# Patient Record
Sex: Male | Born: 1935 | Race: White | Hispanic: No | Smoking: Former smoker
Health system: Southern US, Community
[De-identification: ages and names within clinical notes are randomized; demographics above are authoritative.]

## PROBLEM LIST (undated history)

## (undated) DIAGNOSIS — I10 Essential (primary) hypertension: Secondary | ICD-10-CM

## (undated) DIAGNOSIS — E039 Hypothyroidism, unspecified: Secondary | ICD-10-CM

## (undated) DIAGNOSIS — I714 Abdominal aortic aneurysm, without rupture, unspecified: Secondary | ICD-10-CM

## (undated) DIAGNOSIS — M4800 Spinal stenosis, site unspecified: Secondary | ICD-10-CM

## (undated) DIAGNOSIS — N400 Enlarged prostate without lower urinary tract symptoms: Secondary | ICD-10-CM

## (undated) DIAGNOSIS — J449 Chronic obstructive pulmonary disease, unspecified: Secondary | ICD-10-CM

## (undated) DIAGNOSIS — I6529 Occlusion and stenosis of unspecified carotid artery: Secondary | ICD-10-CM

## (undated) DIAGNOSIS — G2581 Restless legs syndrome: Secondary | ICD-10-CM

## (undated) DIAGNOSIS — R0989 Other specified symptoms and signs involving the circulatory and respiratory systems: Secondary | ICD-10-CM

## (undated) DIAGNOSIS — R0609 Other forms of dyspnea: Secondary | ICD-10-CM

## (undated) DIAGNOSIS — R39198 Other difficulties with micturition: Secondary | ICD-10-CM

## (undated) DIAGNOSIS — I219 Acute myocardial infarction, unspecified: Secondary | ICD-10-CM

## (undated) DIAGNOSIS — F0390 Unspecified dementia without behavioral disturbance: Secondary | ICD-10-CM

## (undated) HISTORY — DX: Occlusion and stenosis of unspecified carotid artery: I65.29

## (undated) HISTORY — DX: Unspecified dementia, unspecified severity, without behavioral disturbance, psychotic disturbance, mood disturbance, and anxiety: F03.90

## (undated) HISTORY — DX: Chronic obstructive pulmonary disease, unspecified: J44.9

## (undated) HISTORY — PX: LUMBAR LAMINECTOMY: SHX95

## (undated) HISTORY — DX: Hypothyroidism, unspecified: E03.9

## (undated) HISTORY — DX: Other forms of dyspnea: R06.09

## (undated) HISTORY — DX: Abdominal aortic aneurysm, without rupture: I71.4

## (undated) HISTORY — DX: Acute myocardial infarction, unspecified: I21.9

## (undated) HISTORY — DX: Benign prostatic hyperplasia without lower urinary tract symptoms: N40.0

## (undated) HISTORY — DX: Abdominal aortic aneurysm, without rupture, unspecified: I71.40

## (undated) HISTORY — DX: Essential (primary) hypertension: I10

## (undated) HISTORY — DX: Other specified symptoms and signs involving the circulatory and respiratory systems: R09.89

## (undated) HISTORY — DX: Spinal stenosis, site unspecified: M48.00

## (undated) HISTORY — DX: Restless legs syndrome: G25.81

## (undated) HISTORY — DX: Other difficulties with micturition: R39.198

---

## 1994-06-13 DIAGNOSIS — I219 Acute myocardial infarction, unspecified: Secondary | ICD-10-CM

## 1994-06-13 HISTORY — DX: Acute myocardial infarction, unspecified: I21.9

## 1996-06-13 HISTORY — PX: CORONARY ARTERY BYPASS GRAFT: SHX141

## 1998-05-25 ENCOUNTER — Ambulatory Visit (HOSPITAL_COMMUNITY): Admission: RE | Admit: 1998-05-25 | Discharge: 1998-05-25 | Payer: Self-pay | Admitting: Cardiology

## 1998-05-25 ENCOUNTER — Encounter: Payer: Self-pay | Admitting: Cardiology

## 1998-07-24 ENCOUNTER — Encounter: Payer: Self-pay | Admitting: Cardiology

## 1998-10-03 ENCOUNTER — Inpatient Hospital Stay (HOSPITAL_COMMUNITY): Admission: EM | Admit: 1998-10-03 | Discharge: 1998-10-12 | Payer: Self-pay | Admitting: Emergency Medicine

## 1998-10-03 ENCOUNTER — Encounter: Payer: Self-pay | Admitting: Cardiology

## 1998-10-05 ENCOUNTER — Encounter: Payer: Self-pay | Admitting: Emergency Medicine

## 1998-10-08 ENCOUNTER — Encounter: Payer: Self-pay | Admitting: Cardiothoracic Surgery

## 1998-10-09 ENCOUNTER — Encounter: Payer: Self-pay | Admitting: Cardiothoracic Surgery

## 1998-11-10 ENCOUNTER — Encounter (HOSPITAL_COMMUNITY): Admission: RE | Admit: 1998-11-10 | Discharge: 1999-01-11 | Payer: Self-pay | Admitting: Cardiology

## 1998-11-13 ENCOUNTER — Ambulatory Visit (HOSPITAL_COMMUNITY): Admission: RE | Admit: 1998-11-13 | Discharge: 1998-11-13 | Payer: Self-pay | Admitting: Cardiology

## 1998-11-13 ENCOUNTER — Encounter: Payer: Self-pay | Admitting: Cardiology

## 1999-01-12 ENCOUNTER — Encounter (HOSPITAL_COMMUNITY): Admission: RE | Admit: 1999-01-12 | Discharge: 1999-04-12 | Payer: Self-pay | Admitting: Cardiology

## 1999-08-04 ENCOUNTER — Encounter: Payer: Self-pay | Admitting: Cardiology

## 1999-08-04 ENCOUNTER — Encounter: Admission: RE | Admit: 1999-08-04 | Discharge: 1999-08-04 | Payer: Self-pay | Admitting: Cardiology

## 2004-06-13 DIAGNOSIS — I714 Abdominal aortic aneurysm, without rupture, unspecified: Secondary | ICD-10-CM

## 2004-06-13 HISTORY — DX: Abdominal aortic aneurysm, without rupture: I71.4

## 2004-06-13 HISTORY — DX: Abdominal aortic aneurysm, without rupture, unspecified: I71.40

## 2004-12-13 ENCOUNTER — Ambulatory Visit: Payer: Self-pay | Admitting: Internal Medicine

## 2004-12-13 ENCOUNTER — Inpatient Hospital Stay (HOSPITAL_COMMUNITY): Admission: EM | Admit: 2004-12-13 | Discharge: 2004-12-16 | Payer: Self-pay | Admitting: Internal Medicine

## 2004-12-15 ENCOUNTER — Ambulatory Visit: Payer: Self-pay | Admitting: Cardiology

## 2004-12-15 ENCOUNTER — Encounter: Payer: Self-pay | Admitting: Cardiology

## 2004-12-23 ENCOUNTER — Ambulatory Visit: Payer: Self-pay | Admitting: Internal Medicine

## 2005-01-06 ENCOUNTER — Ambulatory Visit: Payer: Self-pay | Admitting: Internal Medicine

## 2005-01-18 ENCOUNTER — Ambulatory Visit: Payer: Self-pay | Admitting: Internal Medicine

## 2005-02-11 ENCOUNTER — Ambulatory Visit: Payer: Self-pay | Admitting: Internal Medicine

## 2005-02-17 ENCOUNTER — Ambulatory Visit (HOSPITAL_COMMUNITY): Admission: RE | Admit: 2005-02-17 | Discharge: 2005-02-17 | Payer: Self-pay | Admitting: *Deleted

## 2005-02-23 ENCOUNTER — Emergency Department (HOSPITAL_COMMUNITY): Admission: EM | Admit: 2005-02-23 | Discharge: 2005-02-23 | Payer: Self-pay | Admitting: Emergency Medicine

## 2005-03-07 ENCOUNTER — Ambulatory Visit: Payer: Self-pay | Admitting: Internal Medicine

## 2005-03-27 ENCOUNTER — Ambulatory Visit (HOSPITAL_COMMUNITY): Admission: RE | Admit: 2005-03-27 | Discharge: 2005-03-27 | Payer: Self-pay | Admitting: *Deleted

## 2005-12-30 ENCOUNTER — Ambulatory Visit: Payer: Self-pay | Admitting: Internal Medicine

## 2006-01-18 ENCOUNTER — Ambulatory Visit: Payer: Self-pay | Admitting: Internal Medicine

## 2006-01-18 ENCOUNTER — Encounter (INDEPENDENT_AMBULATORY_CARE_PROVIDER_SITE_OTHER): Payer: Self-pay | Admitting: *Deleted

## 2006-01-18 LAB — CONVERTED CEMR LAB
Cholesterol: 159 mg/dL
Creatinine, Ser: 1.4 mg/dL
Glucose, Bld: 97 mg/dL
HDL: 55 mg/dL
LDL Cholesterol: 90 mg/dL
Triglyceride fasting, serum: 71 mg/dL

## 2006-01-25 ENCOUNTER — Ambulatory Visit: Payer: Self-pay | Admitting: Internal Medicine

## 2006-03-08 LAB — FECAL OCCULT BLOOD, GUAIAC: Fecal Occult Blood: NEGATIVE

## 2006-04-15 ENCOUNTER — Encounter: Payer: Self-pay | Admitting: *Deleted

## 2006-04-15 DIAGNOSIS — I714 Abdominal aortic aneurysm, without rupture, unspecified: Secondary | ICD-10-CM | POA: Insufficient documentation

## 2006-04-15 DIAGNOSIS — M79609 Pain in unspecified limb: Secondary | ICD-10-CM | POA: Insufficient documentation

## 2006-04-15 DIAGNOSIS — I10 Essential (primary) hypertension: Secondary | ICD-10-CM | POA: Insufficient documentation

## 2006-04-15 DIAGNOSIS — G4700 Insomnia, unspecified: Secondary | ICD-10-CM | POA: Insufficient documentation

## 2006-05-19 DIAGNOSIS — E039 Hypothyroidism, unspecified: Secondary | ICD-10-CM | POA: Insufficient documentation

## 2006-05-19 DIAGNOSIS — I252 Old myocardial infarction: Secondary | ICD-10-CM

## 2006-05-19 DIAGNOSIS — I6529 Occlusion and stenosis of unspecified carotid artery: Secondary | ICD-10-CM

## 2006-07-04 ENCOUNTER — Emergency Department (HOSPITAL_COMMUNITY): Admission: EM | Admit: 2006-07-04 | Discharge: 2006-07-04 | Payer: Self-pay | Admitting: Emergency Medicine

## 2006-07-10 ENCOUNTER — Emergency Department (HOSPITAL_COMMUNITY): Admission: EM | Admit: 2006-07-10 | Discharge: 2006-07-10 | Payer: Self-pay | Admitting: Emergency Medicine

## 2006-07-31 ENCOUNTER — Ambulatory Visit: Payer: Self-pay | Admitting: Internal Medicine

## 2006-07-31 ENCOUNTER — Encounter (INDEPENDENT_AMBULATORY_CARE_PROVIDER_SITE_OTHER): Payer: Self-pay | Admitting: *Deleted

## 2006-07-31 DIAGNOSIS — G609 Hereditary and idiopathic neuropathy, unspecified: Secondary | ICD-10-CM | POA: Insufficient documentation

## 2006-07-31 LAB — CONVERTED CEMR LAB
ALT: 9 units/L (ref 0–53)
AST: 15 units/L (ref 0–37)
Albumin: 3.8 g/dL (ref 3.5–5.2)
Alkaline Phosphatase: 80 units/L (ref 39–117)
BUN: 17 mg/dL (ref 6–23)
CO2: 25 meq/L (ref 19–32)
Calcium: 8.9 mg/dL (ref 8.4–10.5)
Chloride: 109 meq/L (ref 96–112)
Creatinine, Ser: 1.05 mg/dL (ref 0.40–1.50)
Glucose, Bld: 70 mg/dL (ref 70–99)
HCT: 36.9 % — ABNORMAL LOW (ref 39.0–52.0)
Hemoglobin: 12.1 g/dL — ABNORMAL LOW (ref 13.0–17.0)
MCHC: 32.8 g/dL (ref 30.0–36.0)
MCV: 103.4 fL — ABNORMAL HIGH (ref 78.0–100.0)
Platelets: 181 10*3/uL (ref 150–400)
Potassium: 4.5 meq/L (ref 3.5–5.3)
RBC: 3.57 M/uL — ABNORMAL LOW (ref 4.22–5.81)
RDW: 14.3 % — ABNORMAL HIGH (ref 11.5–14.0)
Sodium: 142 meq/L (ref 135–145)
TSH: 3.009 microintl units/mL (ref 0.350–5.50)
Total Bilirubin: 0.9 mg/dL (ref 0.3–1.2)
Total Protein: 6.2 g/dL (ref 6.0–8.3)
Vitamin B-12: 546 pg/mL (ref 211–911)
WBC: 6.9 10*3/uL (ref 4.0–10.5)

## 2006-10-24 ENCOUNTER — Ambulatory Visit: Payer: Self-pay | Admitting: Hospitalist

## 2006-10-24 ENCOUNTER — Encounter (INDEPENDENT_AMBULATORY_CARE_PROVIDER_SITE_OTHER): Payer: Self-pay | Admitting: *Deleted

## 2006-10-24 DIAGNOSIS — D539 Nutritional anemia, unspecified: Secondary | ICD-10-CM | POA: Insufficient documentation

## 2006-10-24 LAB — CONVERTED CEMR LAB: RBC Folate: 1145 ng/mL — ABNORMAL HIGH (ref 180–600)

## 2006-11-01 LAB — CONVERTED CEMR LAB
Free T4: 1.04 ng/dL (ref 0.89–1.80)
TSH: 4.495 microintl units/mL (ref 0.350–5.50)

## 2007-02-07 ENCOUNTER — Telehealth: Payer: Self-pay | Admitting: *Deleted

## 2007-09-17 ENCOUNTER — Telehealth (INDEPENDENT_AMBULATORY_CARE_PROVIDER_SITE_OTHER): Payer: Self-pay | Admitting: *Deleted

## 2008-08-08 ENCOUNTER — Inpatient Hospital Stay (HOSPITAL_COMMUNITY): Admission: EM | Admit: 2008-08-08 | Discharge: 2008-08-11 | Payer: Self-pay | Admitting: Emergency Medicine

## 2008-08-08 ENCOUNTER — Ambulatory Visit: Payer: Self-pay | Admitting: Infectious Disease

## 2008-08-09 ENCOUNTER — Encounter: Payer: Self-pay | Admitting: *Deleted

## 2008-08-09 LAB — CONVERTED CEMR LAB
Cholesterol: 151 mg/dL
HDL: 50 mg/dL
LDL Cholesterol: 87 mg/dL
Triglycerides: 69 mg/dL

## 2008-08-11 ENCOUNTER — Ambulatory Visit: Payer: Self-pay | Admitting: Vascular Surgery

## 2008-08-11 ENCOUNTER — Encounter: Payer: Self-pay | Admitting: Infectious Disease

## 2008-08-13 ENCOUNTER — Encounter: Payer: Self-pay | Admitting: Internal Medicine

## 2008-08-20 ENCOUNTER — Encounter: Payer: Self-pay | Admitting: *Deleted

## 2008-09-08 ENCOUNTER — Encounter: Payer: Self-pay | Admitting: Internal Medicine

## 2008-09-08 ENCOUNTER — Ambulatory Visit: Payer: Self-pay | Admitting: Internal Medicine

## 2008-09-08 DIAGNOSIS — Z8669 Personal history of other diseases of the nervous system and sense organs: Secondary | ICD-10-CM | POA: Insufficient documentation

## 2008-09-08 LAB — CONVERTED CEMR LAB
BUN: 20 mg/dL (ref 6–23)
Basophils Absolute: 0 10*3/uL (ref 0.0–0.1)
Basophils Relative: 0 % (ref 0–1)
CO2: 30 meq/L (ref 19–32)
Calcium: 9 mg/dL (ref 8.4–10.5)
Chloride: 109 meq/L (ref 96–112)
Creatinine, Ser: 1.14 mg/dL (ref 0.40–1.50)
Eosinophils Absolute: 0.3 10*3/uL (ref 0.0–0.7)
Eosinophils Relative: 4 % (ref 0–5)
GFR calc Af Amer: >60 mL/min (ref >60)
GFR calc non Af Amer: 60 mL/min — ABNORMAL LOW (ref >60)
Glucose, Bld: 95 mg/dL (ref 70–99)
HCT: 37.1 % — ABNORMAL LOW (ref 39.0–52.0)
Hemoglobin: 12.7 g/dL — ABNORMAL LOW (ref 13.0–17.0)
Lymphocytes Relative: 33 % (ref 12–46)
Lymphs Abs: 2.3 10*3/uL (ref 0.7–4.0)
MCHC: 34.3 g/dL (ref 30.0–36.0)
MCV: 101.2 fL — ABNORMAL HIGH (ref 78.0–100.0)
Monocytes Absolute: 0.6 10*3/uL (ref 0.1–1.0)
Monocytes Relative: 9 % (ref 3–12)
Neutro Abs: 3.8 10*3/uL (ref 1.7–7.7)
Neutrophils Relative %: 54 % (ref 43–77)
Platelets: 166 10*3/uL (ref 150–400)
Potassium: 4.9 meq/L (ref 3.5–5.3)
RBC: 3.66 M/uL — ABNORMAL LOW (ref 4.22–5.81)
RDW: 14.1 % (ref 11.5–15.5)
Sodium: 143 meq/L (ref 135–145)
WBC: 7 10*3/uL (ref 4.0–10.5)

## 2008-11-03 ENCOUNTER — Telehealth: Payer: Self-pay | Admitting: Internal Medicine

## 2008-12-04 ENCOUNTER — Ambulatory Visit: Payer: Self-pay | Admitting: Internal Medicine

## 2009-08-04 IMAGING — CT CT HEAD W/O CM
1 series · 16 of 30 positions shown, 20 images · non-contrast
Comparison: CT brain of 07/10/2006

CLINICAL DATA: Dizziness, weakness, syncope, irregular heartbeat

CT HEAD WITHOUT CONTRAST
TECHNIQUE: Contiguous axial images were obtained from the base of
the skull through the vertex without contrast.

[Series 2: brain · axial · 0.47mm/px · z∈[+120,+261]mm · 16 of 30 slices shown, 20 images]
[im 2/30  brain]
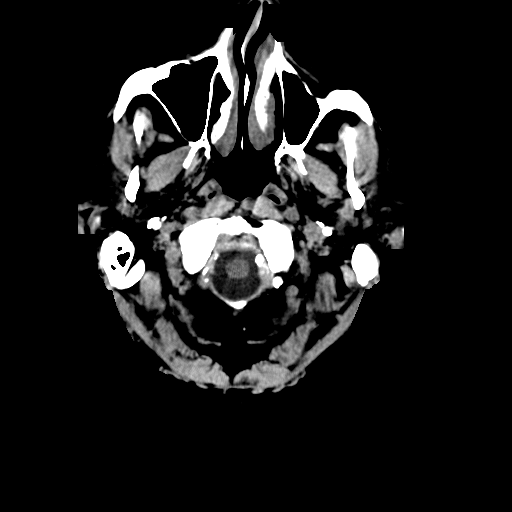
[im 2/30  bone]
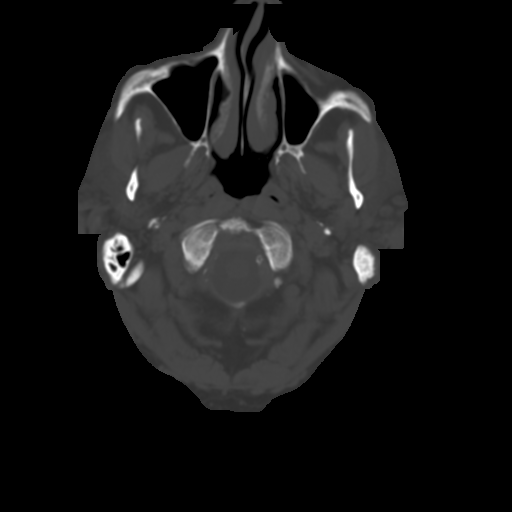
[im 4/30  brain]
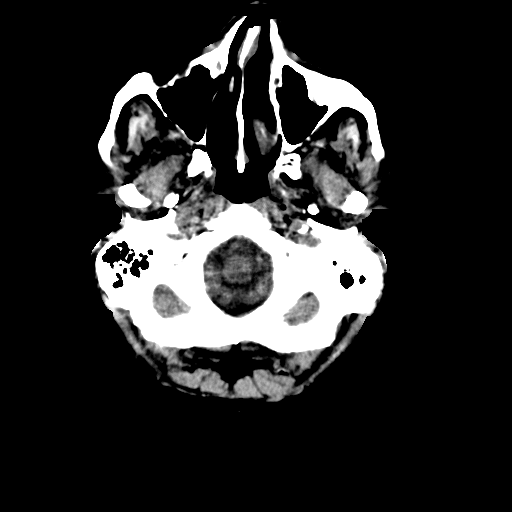
[im 6/30  brain]
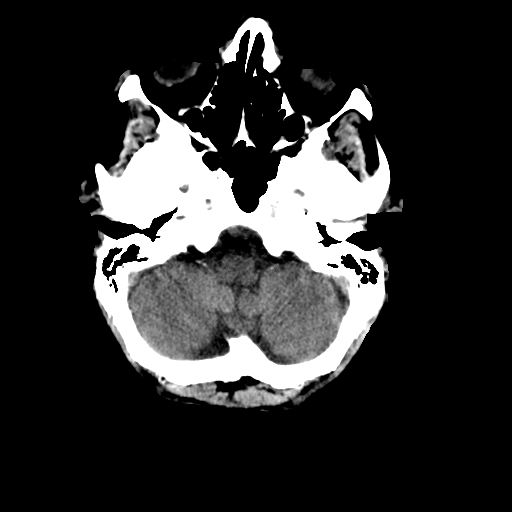
[im 8/30  brain]
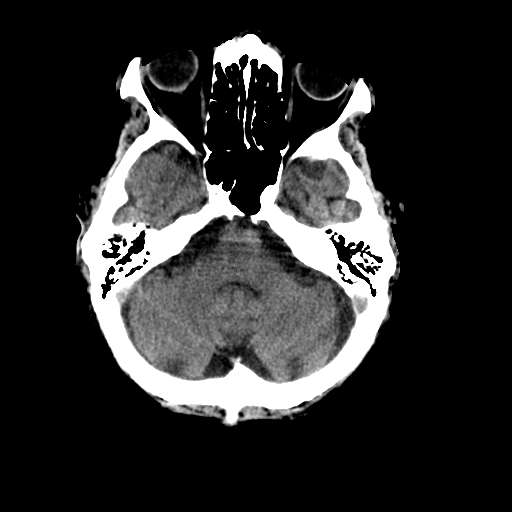
[im 9/30  brain]
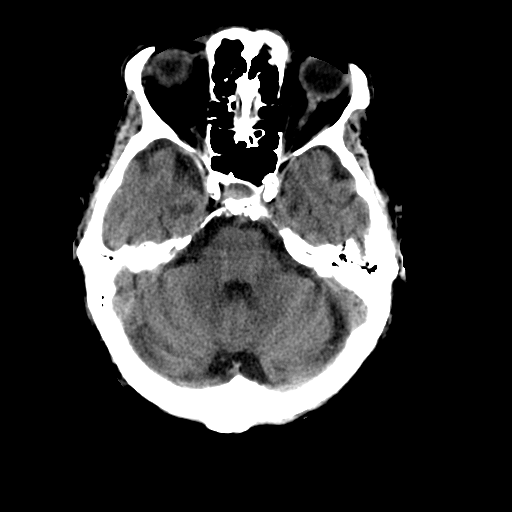
[im 9/30  bone]
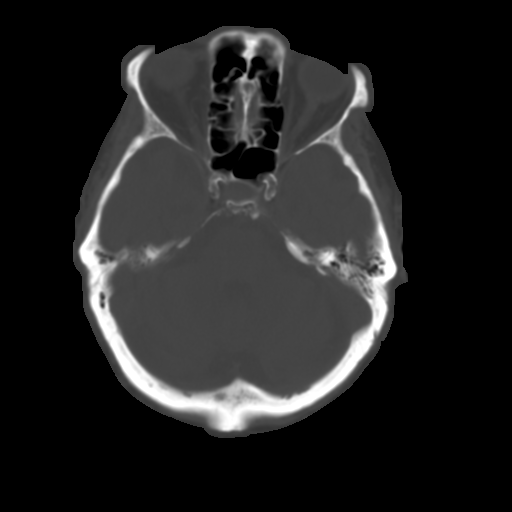
[im 11/30  brain]
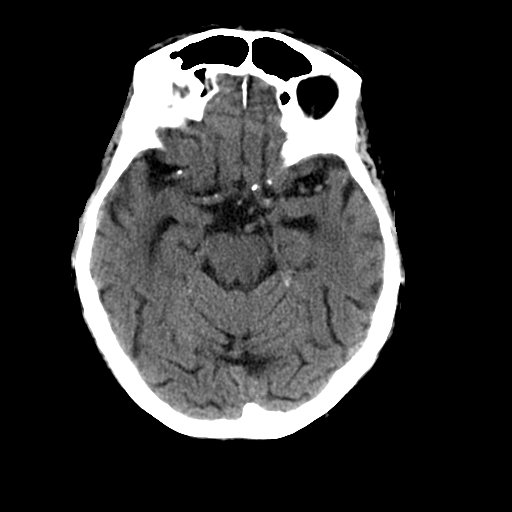
[im 13/30  brain]
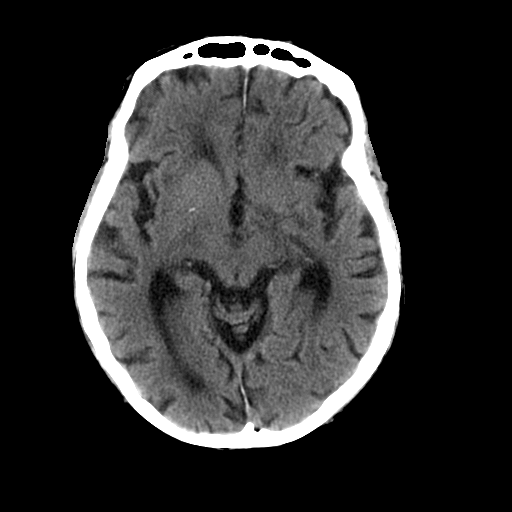
[im 15/30  brain]
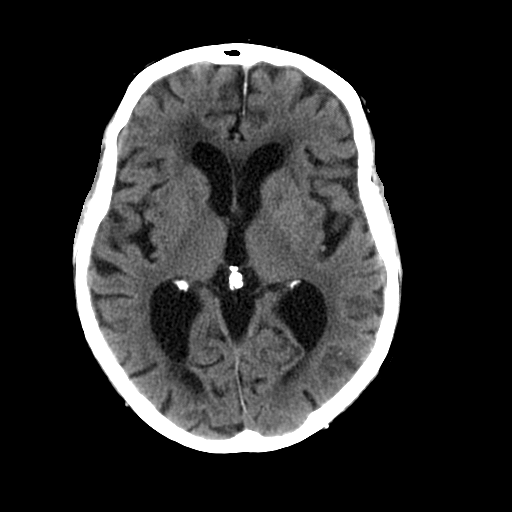
[im 16/30  brain]
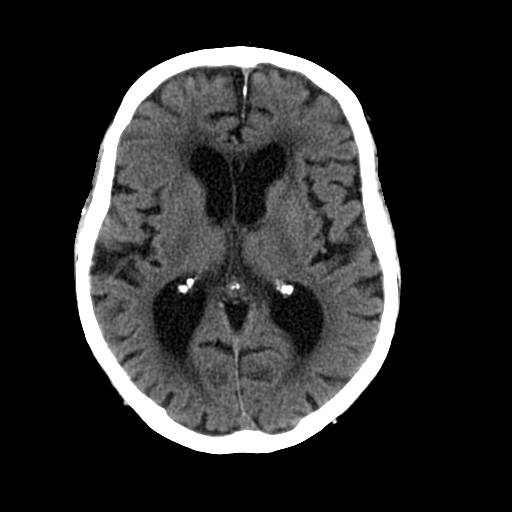
[im 16/30  bone]
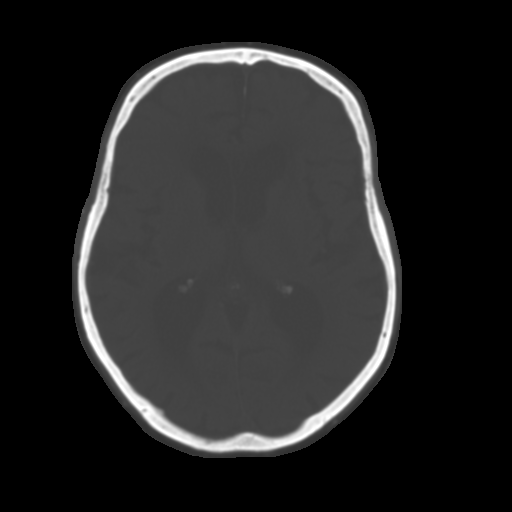
[im 18/30  brain]
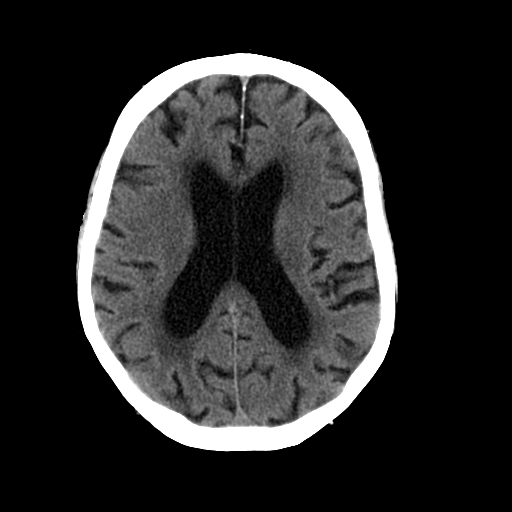
[im 20/30  brain]
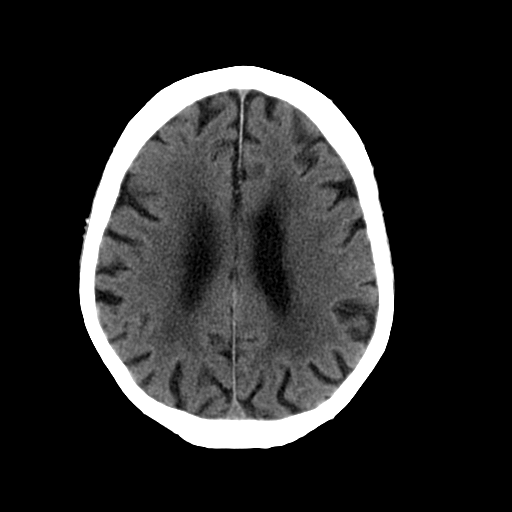
[im 22/30  brain]
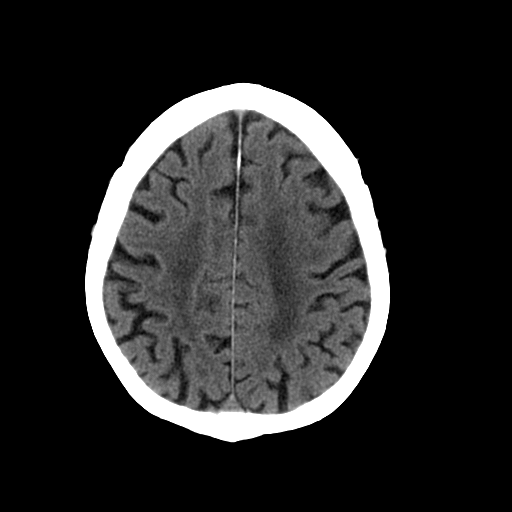
[im 23/30  brain]
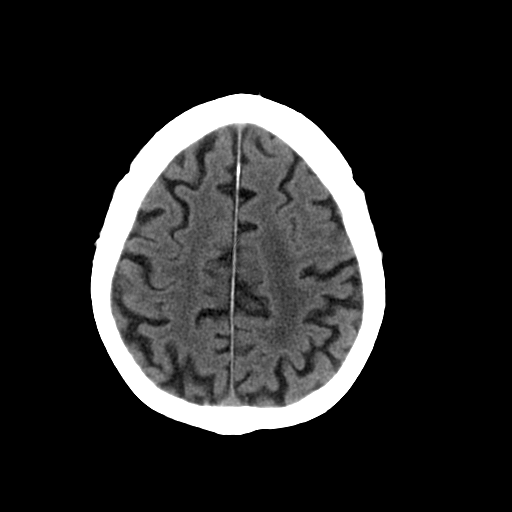
[im 23/30  bone]
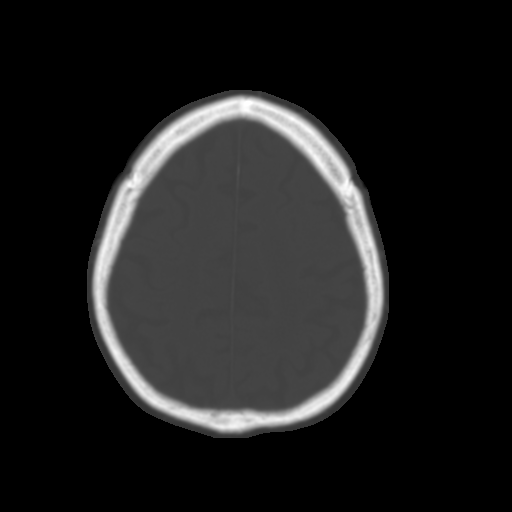
[im 25/30  brain]
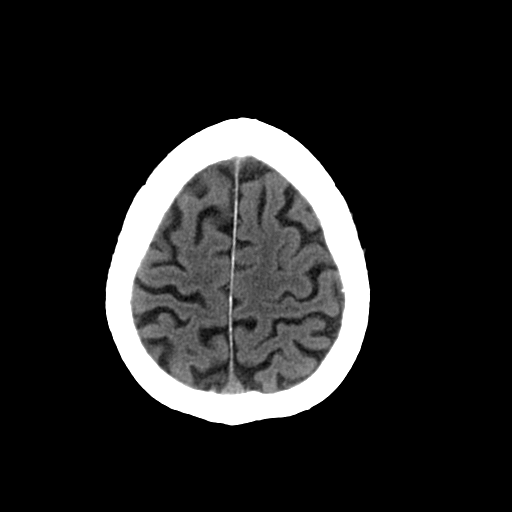
[im 27/30  brain]
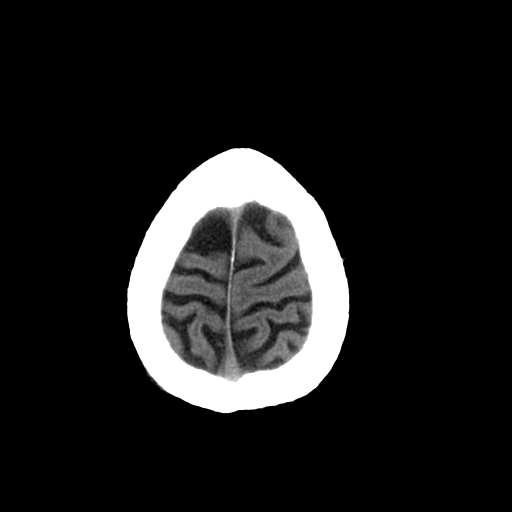
[im 29/30  brain]
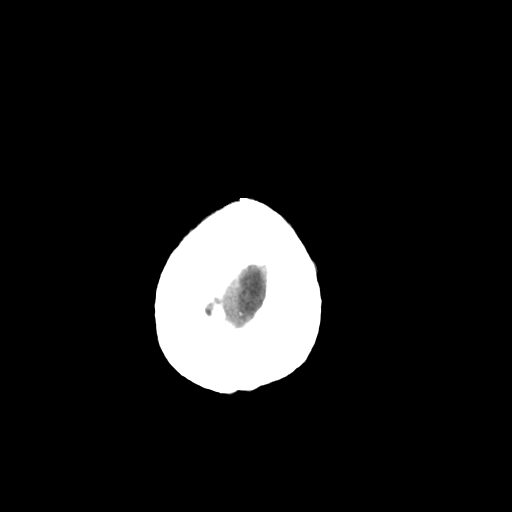

[16 of 30 positions shown; findings below may reference images not displayed]

FINDINGS: There is no change in the degree of dilatation of the
ventricles, with prominent cortical sulci, indicative of diffuse
atrophy.  Moderate small vessel ischemic change is again noted
throughout the periventricular white matter.  No blood, edema, or
mass effect is seen.  No acute calvarial abnormality is seen.
IMPRESSION: Atrophy and small vessel disease.  No acute intracranial
abnormality.

## 2010-06-24 ENCOUNTER — Ambulatory Visit: Admit: 2010-06-24 | Payer: Self-pay

## 2010-07-04 ENCOUNTER — Encounter: Payer: Self-pay | Admitting: *Deleted

## 2010-08-04 ENCOUNTER — Ambulatory Visit: Payer: Self-pay | Admitting: Internal Medicine

## 2010-08-05 ENCOUNTER — Encounter: Payer: Self-pay | Admitting: Internal Medicine

## 2010-09-28 LAB — CBC
HCT: 35.6 % — ABNORMAL LOW (ref 39.0–52.0)
Hemoglobin: 11.3 g/dL — ABNORMAL LOW (ref 13.0–17.0)
Hemoglobin: 12.4 g/dL — ABNORMAL LOW (ref 13.0–17.0)
MCHC: 34.6 g/dL (ref 30.0–36.0)
MCHC: 34.9 g/dL (ref 30.0–36.0)
MCV: 101.4 fL — ABNORMAL HIGH (ref 78.0–100.0)
MCV: 99.5 fL (ref 78.0–100.0)
Platelets: 160 10*3/uL (ref 150–400)
Platelets: 172 10*3/uL (ref 150–400)
RBC: 3.17 MIL/uL — ABNORMAL LOW (ref 4.22–5.81)
RBC: 3.31 MIL/uL — ABNORMAL LOW (ref 4.22–5.81)
RBC: 3.57 MIL/uL — ABNORMAL LOW (ref 4.22–5.81)
RDW: 14.4 % (ref 11.5–15.5)
WBC: 6.5 10*3/uL (ref 4.0–10.5)
WBC: 7.1 10*3/uL (ref 4.0–10.5)
WBC: 7.3 10*3/uL (ref 4.0–10.5)

## 2010-09-28 LAB — DIFFERENTIAL
Basophils Absolute: 0.1 10*3/uL (ref 0.0–0.1)
Basophils Relative: 1 % (ref 0–1)
Eosinophils Absolute: 0.2 10*3/uL (ref 0.0–0.7)
Eosinophils Relative: 3 % (ref 0–5)
Lymphocytes Relative: 29 % (ref 12–46)
Lymphs Abs: 2 10*3/uL (ref 0.7–4.0)
Monocytes Absolute: 0.6 10*3/uL (ref 0.1–1.0)
Monocytes Relative: 9 % (ref 3–12)
Neutro Abs: 4.1 10*3/uL (ref 1.7–7.7)
Neutrophils Relative %: 59 % (ref 43–77)

## 2010-09-28 LAB — RAPID URINE DRUG SCREEN, HOSP PERFORMED
Barbiturates: NOT DETECTED
Benzodiazepines: NOT DETECTED
Cocaine: NOT DETECTED
Opiates: NOT DETECTED

## 2010-09-28 LAB — COMPREHENSIVE METABOLIC PANEL
ALT: 15 U/L (ref 0–53)
AST: 21 U/L (ref 0–37)
Alkaline Phosphatase: 70 U/L (ref 39–117)
GFR calc Af Amer: >60 mL/min (ref >60)
Glucose, Bld: 101 mg/dL — ABNORMAL HIGH (ref 70–99)
Potassium: 4.9 mEq/L (ref 3.5–5.1)
Sodium: 141 mEq/L (ref 135–145)
Total Protein: 6.3 g/dL (ref 6.0–8.3)

## 2010-09-28 LAB — POCT I-STAT, CHEM 8
BUN: 21 mg/dL (ref 6–23)
Calcium, Ion: 1.17 mmol/L (ref 1.12–1.32)
Chloride: 110 mEq/L (ref 96–112)
Creatinine, Ser: 1.4 mg/dL (ref 0.4–1.5)
Glucose, Bld: 81 mg/dL (ref 70–99)
HCT: 37 % — ABNORMAL LOW (ref 39.0–52.0)
Hemoglobin: 12.6 g/dL — ABNORMAL LOW (ref 13.0–17.0)
Potassium: 4.9 mEq/L (ref 3.5–5.1)
Sodium: 142 mEq/L (ref 135–145)
TCO2: 23 mmol/L (ref 0–100)

## 2010-09-28 LAB — CARDIAC PANEL(CRET KIN+CKTOT+MB+TROPI)
CK, MB: 1.3 ng/mL (ref 0.3–4.0)
Relative Index: INVALID (ref 0.0–2.5)
Relative Index: INVALID (ref 0.0–2.5)
Total CK: 36 U/L (ref 7–232)
Total CK: 47 U/L (ref 7–232)
Troponin I: 0.01 ng/mL (ref 0.00–0.06)
Troponin I: 0.01 ng/mL (ref 0.00–0.06)

## 2010-09-28 LAB — BASIC METABOLIC PANEL
CO2: 25 mEq/L (ref 19–32)
Calcium: 8.5 mg/dL (ref 8.4–10.5)
Chloride: 111 mEq/L (ref 96–112)
Creatinine, Ser: 1.09 mg/dL (ref 0.4–1.5)
Creatinine, Ser: 1.09 mg/dL (ref 0.4–1.5)
GFR calc Af Amer: >60 mL/min (ref >60)
GFR calc Af Amer: >60 mL/min (ref >60)
Sodium: 143 mEq/L (ref 135–145)
Sodium: 143 mEq/L (ref 135–145)

## 2010-09-28 LAB — URINALYSIS, ROUTINE W REFLEX MICROSCOPIC
Bilirubin Urine: NEGATIVE
Glucose, UA: NEGATIVE mg/dL
Hgb urine dipstick: NEGATIVE
Ketones, ur: NEGATIVE mg/dL
Protein, ur: NEGATIVE mg/dL
pH: 6 (ref 5.0–8.0)

## 2010-09-28 LAB — POCT CARDIAC MARKERS
CKMB, poc: 1.1 ng/mL (ref 1.0–8.0)
Myoglobin, poc: 102 ng/mL (ref 12–200)
Troponin i, poc: <0.05 ng/mL (ref 0.00–0.09)

## 2010-09-28 LAB — LIPID PANEL
Triglycerides: 69 mg/dL (ref <150)
VLDL: 14 mg/dL (ref 0–40)

## 2010-09-28 LAB — PROTIME-INR
INR: 1.1 (ref 0.00–1.49)
Prothrombin Time: 14 seconds (ref 11.6–15.2)

## 2010-09-28 LAB — VITAMIN B12: Vitamin B-12: 533 pg/mL (ref 211–911)

## 2010-09-28 LAB — HEMOGLOBIN A1C: Hgb A1c MFr Bld: 5.3 % (ref 4.6–6.1)

## 2010-09-28 LAB — TSH
TSH: 3.023 u[IU]/mL (ref 0.350–4.500)
TSH: 3.313 u[IU]/mL (ref 0.350–4.500)

## 2010-09-28 LAB — BRAIN NATRIURETIC PEPTIDE: Pro B Natriuretic peptide (BNP): 707 pg/mL — ABNORMAL HIGH (ref 0.0–100.0)

## 2010-09-28 LAB — RPR: RPR Ser Ql: NONREACTIVE

## 2010-09-28 LAB — MAGNESIUM: Magnesium: 2.3 mg/dL (ref 1.5–2.5)

## 2010-09-28 LAB — CK TOTAL AND CKMB (NOT AT ARMC): Total CK: 43 U/L (ref 7–232)

## 2010-09-28 LAB — T4, FREE: Free T4: 0.96 ng/dL (ref 0.89–1.80)

## 2010-09-28 LAB — FOLATE RBC: RBC Folate: 1339 ng/mL — ABNORMAL HIGH (ref 180–600)

## 2010-10-26 NOTE — Discharge Summary (Signed)
NAME:  Vernon English, KRING NO.:  [id number]   MEDICAL RECORD NO.:  [id number]          PATIENT TYPE:  INP   LOCATION:  3712                         FACILITY:  [facility]   PHYSICIAN:  Acey Lav, MD  DATE OF BIRTH:  1916-06-13   DATE OF ADMISSION:  08/08/2008  DATE OF DISCHARGE:  08/11/2008                               DISCHARGE SUMMARY   DIAGNOSES:  1. Syncope.  possibly due secondary to sick sinus syndrome or other      rythym disturbance, no syncopal episodes during the      hospitalization.  The patient was stable on discharge.  2. Macrocytic anemia.  Stable with baseline hemoglobin at 11-12.  3. Hypothyroidism.  TSH stable.  No medication indicated.  4. Abdominal aortic aneurysm.  stable since 2001 size 3.6 x 3.90 in      2001 and 2006.  5. Hypertension.  Not on any medication.  Isolated systolic      hypertension with blood pressure in the 140s.  6. Benign prostatic hypertrophy.  Not on any medication.  no      obstructive symptoms.  7. Chronic obstructive pulmonary disease.  Stable with no signs of      exacerbation during the hospitalization.  8. Cellulitis of the right hand in 2006.  9. Bilateral carotid stenosis 50% occlusion bilaterally.  10.Lumbar spine stenosis L3-L4.  11.Coronary artery disease status post coronary artery bypass graft      after MI in 1996.   DISCHARGE MEDICATIONS:  1. Aspirin 325 mg take one tablet once daily.  2. Multivitamin take one tablet once daily.   DISPOSITION/FOLLOWUP:  The patient was discharged from the unit in  stable condition with no syncopal episodes during the hospitalization,  ambulating without assistance, no falls.  Stable and Afebrile throughout  the hospitalization.  On follow-up appointment please assess B-met for  electrolyte abnormalities.  On admission creatinine was 1.4 which was  likely secondary to dehydration on discharge creatinine was within  normal limits.  Please also check CBC since the  patient has anemia of  chronic disease with MCV in lower 100, so assess if stable.  Will also  follow up on results of 2-D echo since on discharge 2-D echo pending.  Also, assess blood pressure and if systolic blood pressure in stable  range.   CONSULTATIONS:  None.   PROCEDURES PERFORMED:  February 26 chest x-ray, cardiomegaly and the  patient status post CABG.  Lungs emphysematous scarring noted and right  and left lung base, unchanged from previous study, no pleural effusion.  No acute disease.  CT of the head without contrast August 08, 2008.  No change in degree of dilation of the ventricles, moderate small vessel  ischemic change noted, unchanged from previous study, no blood, edema or  mass effect seen.  No acute intracranial abnormalities Overall.   HISTORY OF PRESENT ILLNESS:  The patient is a 77 year old male with past  medical history significant for coronary artery disease status post CABG  after MI in 1996, history of presyncope, vertigo, aortic stenosis,  hypothyroidism, abdominal aortic aneurysm and bilateral carotid artery  stenosis who presents to ED concerned for syncopal episode that occurred  5 days prior to admission.  He reports feeling dizzy at that time,  especially with standing up, and he had one syncopal episodes.  He  reports history of occasional dizziness, especially associated with  standing up that has been going on for many years.  In addition, he  reports occasional chronic cough productive of white sputum, very small  amount.  He denies headaches, visual changes, chest pain, palpitations,  no shortness of breath.  No lower extremity edema.  No abdominal  concerns such as nausea, vomiting, diarrhea, constipation.  The patient  reports not losing consciousness at the time of the episode which  happened while he was shopping at [facility].  No tongue biting.  No urinary  or bowel incontinence.   PHYSICAL EXAMINATION:  VITAL SIGNS:  Temperature 97.6, blood  pressure  155/61, pulse 55, respirations 20, saturating 100% on room air.  GENERAL:  The patient sitting in bed, not in acute distress.  HEENT:  PERRLA, EOMI, moist mucous membranes, no oropharyngeal erythema.  No scleral icterus.  NECK:  Supple.  Full range of motion.  No lymphadenopathy.  RESPIRATIONS:  Clear to auscultation bilaterally.  No wheezing, crackles  or rhonchi.  CARDIOVASCULAR:  Regular rhythm, bradycardic.  S1, S2 present.  Occasional ectopic beats, systolic ejection murmurs, 3/6 no rubs or  gallops.  ABDOMEN:  Soft, nontender, nondistended.  Bowel sounds present.  GU:  No gross vertebral angle tenderness.  LYMPH:  No lymphadenopathy.  MUSCULOSKELETAL:  Full range of motion.  No effusion or stiffness.  NEUROLOGICAL:  Alert and oriented x3.  Cranial nerves II-XII grossly  intact.  Motor and sensation in both intact.   LABORATORY DATA:  NA 142, K 4.9, CL 110, HCO3 23, BUN 24, CR 1.4,  glucose 81, WBC 7.1, hemoglobin 12.4, platelets 172, MCV 99.5, ANC 4.1.  PT 14, INR 1.1.   PROBLEMS:  1. Syncope in the setting of bradycardia and one episode of blacking      out.  Most worrisome was cardiac etiology, specifically sick sinus      syndrome.  The patient was admitted to telemetry.  Cardiac enzymes      were cycled and EKG was reviewed.  Initial EKG showed normal sinus      rhythm at 58 beats per minute, occasional premature atrial      complexes.  No significant ST-T segment changes.  Review of      telemetry did not show any pauses, blocks.  No ST-T segment      abnormalities.  No arrhythmia.  In addition, cardiac enzymes x3 all      negative.  UDS negative and TSH was within normal limits.  Concern      was also for neurologic etiology of syncope such as TIA or CVA,      however, unlikely since on physical exam no acute neurologic      findings.  CT of the head was negative.  Carotid artery Doppler      done and showed no significant stenosis on discharge 2-D echo.       Standing orthostatics were within normal limits.  Fasting lipid      profile was within normal limits, slightly elevated LDL at 87 and      (goal LDL less than 70).  Electrolytes remained within normal      limits.  B12 level within normal limits.  RPR nonreactive.  The  patient was discharged in stable condition with no syncopal      episodes and ambulatory.  We offered the patient holter monitor,      event monitor and referral to cardiology but he was against this.      He is agreeable to have this workup completed should he have return      of syncope or presyncompal symptoms.  2. Macrocytic anemia.  This was worked up in the past.  The patient      had increased folic acid, normal MMA, B12 and folate during this      admission within normal limits, MCV stable, hemoglobin stable at      12.  3. Hypertension.  The patient is not on any medications at home.  He      has isolated systolic blood pressure.  No indication for treatment      in the setting of aortic stenosis and carotid artery occlusion      bilateral.  4. Benign prostatic hypertrophy.  The patient is not on any      medications for it, has been stable.  No obstructive symptoms.  5. Chronic obstructive pulmonary disease.  There were no signs of      extubation during the hospitalization.  The patient maintained      saturations over 95% on room air.  6. Hypothyroidism.  The patient is not on any medications for      hypothyroidism.  TSH was checked and was within normal limits.      Therefore, no medical treatment indicated.  7. Abdominal aortic aneurysm.  Comparing records from 2001 and 2006,      aneurysm only slightly increased in size from 3.6 cm to 3.9.      Therefore, no additional workup indicated at this time.  8. History of coronary artery bypass graft, status post myocardial      infarctions in 1996.  Discussed with the patient possibility of      having pacemaker put in if needed.  However, the patient  reports      not being interested in procedure and elected not to have it done.      We will follow up with the patient in outpatient clinic for further      management.   DISCHARGE VITAL SIGNS:  Temperature 97, blood pressure 150/72, pulse 80,  respirations 18, saturating 95% on room air.   DISCHARGE LABORATORIES:  Labs drawn on August 10, 2008, NA 142, K 4.5  CL 113, HCO3 27, BUN 20, creatinine 1.09 and glucose 95, calcium 8.5,  WBC 6.5, hemoglobin 11.3, platelets 162, MCV 100.   TOTAL TIME OF DISCHARGE:  Over 30 minutes spent on dictation.      Mliss Sax, MD  Electronically Signed      Acey Lav, MD  Electronically Signed    IM/MEDQ  D:  08/11/2008  T:  08/11/2008  Job:  [id number]

## 2010-10-29 NOTE — Discharge Summary (Signed)
NAME:  Vernon English, Vernon English NO.:  [id number]   MEDICAL RECORD NO.:  [id number]          PATIENT TYPE:  INP   LOCATION:  4734                         FACILITY:  [facility]   PHYSICIAN:  Madaline Guthrie, M.D.    DATE OF BIRTH:  02/28/1916   DATE OF ADMISSION:  12/13/2004  DATE OF DISCHARGE:  12/16/2004                                 DISCHARGE SUMMARY   DISCHARGE DIAGNOSES:  1.  Near syncope of undetermined origin.  2.  Status post myocardial infarction in 1996 with coronary artery bypass      grafting x4.  3.  History of hypothyroidism, now untreated for 9 months with a normal TSH      and free T4.  4.  History of carotid stenosis at 51% bilaterally.  5.  Mild aortic stenosis, aortic insufficiency, and mitral stenosis.  6.  Intracranial atherosclerosis.   DISCHARGE MEDICATIONS:  1.  Aggrenox 1 capsule p.o. b.i.d.   CONDITION ON DISCHARGE:  Asymptomatic.   SPECIAL INSTRUCTIONS:  Will need followup on folate and vitamin B12 levels,  which are pending at this moment.   PROCEDURE:  1.  CT of the head showed no acute intracranial abnormality.  2.  MRI of the head showed no abnormalities.  3.  MRA of the head showed intracranial atherosclerotic type of changes.   HISTORY OF PRESENT ILLNESS:  Vernon English is an 77 year old patient who  presented with a near syncope episode when getting out of bed in the  morning. He felt dizzy and light headed with no associated chest pain,  palpitations or nausea. He had no hearing loss, fullness in the ears or  tinnitus but says that he did feel dizzy to a lesser extent when he turns  his head to the left. He had no associated loss of vision or paralysis or  numbness. He felt that way every time that he tried to sit or stand up since  that morning. He says that he has had a similar episode 1 time the year  before. Note that the patient is under a lot of stress in the past few days  due to the sudden death of his oldest son from an acute  myocardial  infarction. He did not have any fever or chills.   PHYSICAL EXAMINATION:  VITAL SIGNS:  Pulse 66, blood pressure very variable  going from 182/78 to 145/62. Temperature of 97.9. Respiratory rate of 20.  Saturation of 97% on room air.  GENERAL:  Alert and oriented. Had no nystagmus with the lateral rotation of  the head. No carotid bruit.  LUNGS:  Examination had no particularities.  HEART:  We could hear a 3 over 6 systolic murmur, heard best at the base,  without radiation.  The rest of the examinations including pulses and neurologic examination  were normal.   LABORATORY DATA:  On admission white blood cell count of 6.7. Hemoglobin  12.5 with a MCV of 99.9. Platelets at 190,000. His cardiac markers were  negative x3. Electrolytes were normal. TSH and free T4 were normal.   A head CT showed no acute findings.  Orthostatic blood pressure showed drop in systolic pressure of 19 from lying  to standing and a pulse going from 64 to 63.   HOSPITAL COURSE:  Vernon English was put on the monitor for observation. He had  no alarming events that could explain his dizziness. MRI and CT of the head  showed no abnormalities. MRA, however, showed intracranial atherosclerotic  type of changes. We then started him on Aggrenox twice a day, since we could  not exclude the possibility that his symptoms were due to transient ischemic  attack instead of an inner ear problem, orthostatic's, or stress.   DISCHARGE LABORATORY DATA/VITALS:  His blood pressure went from 184/88 to  149/71. He had a regular pulse of 78, respiratory rate of 20, temperature  98.2 and saturation at 97% on room air.   White blood cells at 6.8, hemoglobin 11.6 with a MCV of 100.3 and platelets  at 173. Electrolytes were normal. Had a lipid panel, which was completely  normal also. Pending labs are folate and vitamin B12 levels.   FOLLOW UP:  I will see Vernon English in the outpatient clinic for hospital  followup on  December 23, 2004 at 2:30 p.m.       VD/MEDQ  D:  12/16/2004  T:  12/17/2004  Job:  [id number]   cc:   Madaline Guthrie, M.D.  [address]  [place] [address]  Fax: [phone]

## 2011-01-21 ENCOUNTER — Ambulatory Visit (INDEPENDENT_AMBULATORY_CARE_PROVIDER_SITE_OTHER): Payer: Self-pay | Admitting: Internal Medicine

## 2011-01-21 ENCOUNTER — Encounter: Payer: Self-pay | Admitting: Internal Medicine

## 2011-01-21 VITALS — BP 126/60 | HR 58 | Temp 96.7°F | Ht 70.5 in | Wt 179.2 lb

## 2011-01-21 DIAGNOSIS — I10 Essential (primary) hypertension: Secondary | ICD-10-CM

## 2011-01-21 NOTE — Assessment & Plan Note (Addendum)
Blood pressure is well controlled. We recheck blood pressure 15 minutes into the examination and the blood pressure was 120/86. Patient was advised to check his blood pressure regularly at home and to cause tachycardia the numbers are higher than 150/90.

## 2011-01-21 NOTE — Progress Notes (Signed)
  Subjective:    Patient ID: Vernon English, male    DOB: 06/05/1916, 77 y.o.   MRN: [id number]  HPI Patient is a 77 year old male with past medical history outlined below who presents to clinic for regular followup on hypertension. He reports compliance with recommended medications exercise and diet. He denies recent sicknesses or hospitalizations, no episodes of chest pain or shortness of breath, no abdominal urine or concerns, no fevers chills or cough.   Review of Systems Constitutional: Denies fever, chills, diaphoresis, appetite change and fatigue.  HEENT: Denies photophobia, eye pain, redness, hearing loss, ear pain, congestion, sore throat, rhinorrhea, sneezing, mouth sores, trouble swallowing, neck pain, neck stiffness and tinnitus.   Respiratory: Denies SOB, DOE, cough, chest tightness,  and wheezing.   Cardiovascular: Denies chest pain, palpitations and leg swelling.  Gastrointestinal: Denies nausea, vomiting, abdominal pain, diarrhea, constipation, blood in stool and abdominal distention.  Genitourinary: Denies dysuria, urgency, frequency, hematuria, flank pain and difficulty urinating.  Musculoskeletal: Denies myalgias, back pain, joint swelling, arthralgias and gait problem.  Skin: Denies pallor, rash and wound.  Neurological: Denies dizziness, seizures, syncope, weakness, light-headedness, numbness and headaches.  Hematological: Denies adenopathy. Easy bruising, personal or family bleeding history  Psychiatric/Behavioral: Denies suicidal ideation, mood changes, confusion, nervousness, sleep disturbance and agitation      Objective:   Physical Exam Constitutional: Vital signs reviewed.  Patient is a well-developed and well-nourished in no acute distress and cooperative with exam. Alert and oriented x3.  Head: Normocephalic and atraumatic Ear: TM normal bilaterally Mouth: no erythema or exudates, MMM Eyes: PERRL, EOMI, conjunctivae normal, No scleral icterus.  Neck:  Supple, Trachea midline normal ROM, No JVD, mass, thyromegaly, or carotid bruit present.  Cardiovascular: RRR, S1 normal, S2 normal, no MRG, pulses symmetric and intact bilaterally Pulmonary/Chest: CTAB, no wheezes, rales, or rhonchi Abdominal: Soft. Non-tender, non-distended, bowel sounds are normal, no masses, organomegaly, or guarding present.  GU: no CVA tenderness Musculoskeletal: No joint deformities, erythema, or stiffness, ROM full and no nontender Hematology: no cervical, inginal, or axillary adenopathy.  Neurological: A&O x3, Strenght is normal and symmetric bilaterally, cranial nerve II-XII are grossly intact, no focal motor deficit, sensory intact to light touch bilaterally.  Skin: Warm, dry and intact. No rash, cyanosis, or clubbing.  Psychiatric: Normal mood and affect. speech and behavior is normal. Judgment and thought content normal. Cognition and memory are normal.          Assessment & Plan:

## 2011-01-26 NOTE — Progress Notes (Signed)
Reck BP 01/26/11 10:30AM 156/68 pulse 67 left arm per Dr Aldine Contes. Stanton Kidney Sharron Simpson RN 01/26/11 10:30AM

## 2011-06-21 ENCOUNTER — Ambulatory Visit (INDEPENDENT_AMBULATORY_CARE_PROVIDER_SITE_OTHER): Payer: Medicare Other

## 2011-06-21 DIAGNOSIS — G2581 Restless legs syndrome: Secondary | ICD-10-CM

## 2011-08-26 ENCOUNTER — Telehealth: Payer: Self-pay

## 2011-08-26 NOTE — Telephone Encounter (Signed)
DR.HOPPER:  HOSPICE STATES THAT PT'S SON WOULD LIKE FOR PT TO BE PLACED UNDER THE CARE OF HOSPICE. THEY WILL NEED AN ORDER FROM YOU IN ORDER FOR PT TO HAVE HOSPICE. THEY ALSO WOULD LIKE TO KNOW IF YOU WOULD BE THE ATTENDING PROVIDER. [phone] [phone]

## 2011-08-29 NOTE — Telephone Encounter (Signed)
Kathy from Hospice called back concerning patient. I informed her that Dr. Alwyn Ren would not be able to take care of the Pt as a Hospice Doctor. He also would need to consult with Alwyn Ren for him to clear for Hospice. I also informed her that [place] will be [place] for [place] next couple weeks and he would be unable to f/u on this matter.

## 2011-08-29 NOTE — Telephone Encounter (Signed)
I would need to see the patient to clearly document the need for hospice. I cannot be the hospice physician for the patient. I feel that [place] am gone [place] to often to allow proper supervision of someone on hospice.

## 2011-09-29 ENCOUNTER — Telehealth: Payer: Self-pay

## 2011-09-29 NOTE — Telephone Encounter (Signed)
DR. Nicholaus Bloom at [facility] would like an order to place this patient in their care.  Also, would you like to be the attending physician?    Phone number is [phone] Valinda Hoar number is [phone]

## 2011-09-30 NOTE — Telephone Encounter (Signed)
I am not assuming responsibility for being the hospice doctor for any patients as I feel I cannot do so with my periodic absences.  I advise they see an internist or geriatrics specialist (give the geriatric group contact info to them).

## 2011-10-03 NOTE — Telephone Encounter (Signed)
Spoke with hospice and let them know that Dr. Alwyn Ren was not comfortable taking over this care due to his traveling out of the country so we recommended that they call Dr Lenoria Farrier Neva Seat.  They understood and were going to call him today.

## 2011-12-16 ENCOUNTER — Telehealth: Payer: Self-pay

## 2011-12-16 NOTE — Telephone Encounter (Signed)
CATHY FROM HOSPICE WOULD LIKE OV NOTES FAXED ON PT. PLEASE FAX TO [phone] AND THE PHONE IS [phone]

## 2011-12-19 ENCOUNTER — Telehealth: Payer: Self-pay

## 2011-12-19 NOTE — Telephone Encounter (Signed)
Printed out phone message due to patient only having a paper chart. °

## 2012-03-06 NOTE — Telephone Encounter (Signed)
done

## 2012-09-12 ENCOUNTER — Other Ambulatory Visit: Payer: Self-pay | Admitting: Nurse Practitioner

## 2012-10-11 ENCOUNTER — Other Ambulatory Visit: Payer: Self-pay | Admitting: Internal Medicine

## 2012-10-12 ENCOUNTER — Other Ambulatory Visit: Payer: Self-pay | Admitting: *Deleted

## 2012-10-12 ENCOUNTER — Encounter: Payer: Self-pay | Admitting: Geriatric Medicine

## 2012-10-12 MED ORDER — ROPINIROLE HCL 1 MG PO TABS: ORAL_TABLET | ORAL | Status: DC

## 2012-10-15 ENCOUNTER — Ambulatory Visit (INDEPENDENT_AMBULATORY_CARE_PROVIDER_SITE_OTHER): Payer: Medicare Other | Admitting: Nurse Practitioner

## 2012-10-15 ENCOUNTER — Encounter: Payer: Self-pay | Admitting: Nurse Practitioner

## 2012-10-15 VITALS — BP 118/58 | HR 66 | Temp 96.1°F | Resp 16 | Ht 69.0 in | Wt 167.8 lb

## 2012-10-15 DIAGNOSIS — K5900 Constipation, unspecified: Secondary | ICD-10-CM | POA: Insufficient documentation

## 2012-10-15 DIAGNOSIS — I714 Abdominal aortic aneurysm, without rupture: Secondary | ICD-10-CM

## 2012-10-15 DIAGNOSIS — G2581 Restless legs syndrome: Secondary | ICD-10-CM | POA: Insufficient documentation

## 2012-10-15 MED ORDER — ROPINIROLE HCL 1 MG PO TABS: ORAL_TABLET | ORAL | Status: DC

## 2012-10-15 NOTE — Assessment & Plan Note (Signed)
Unchanged- does not wish to have any additional studies done

## 2012-10-15 NOTE — Progress Notes (Signed)
Patient ID: Vernon English, male   DOB: [birth year], 79 y.o.   MRN: [id number] Code Status: NCB   No Known Allergies  Chief Complaint  Patient presents with  . Medical Managment of Chronic Issues  . Constipation  . restless legs    HPI: Patient is a 79 y.o. male seen in the office today for routine follow up Constipation worse- only uses MOM  Restless leg getting worse- Rx if for every 8 hours but he is needing it more frequently--at this time every 6 hours worse  Stopped taking namenda just because and does not wish to restart it Not having paranoid behaviors any more at this time.   Review of Systems:  Review of Systems  Constitutional: Negative for weight loss.  Respiratory: Negative for cough. Shortness of breath: shortness of breath with exertion    Cardiovascular: Negative for chest pain, palpitations and leg swelling.  Gastrointestinal: Positive for constipation. Negative for heartburn.  Musculoskeletal: Negative for myalgias, back pain, joint pain and falls.  Skin: Negative.   Neurological: Negative for dizziness, weakness and headaches.       Restless leg syndrome   Psychiatric/Behavioral: Positive for memory loss.     Past Medical History  Diagnosis Date  . Hypertension   . Hypothyroidism   . Abdominal aortic aneurysm 2006    3.9x3.6 cm per CT scan 2006  . Carotid artery stenosis   . COPD (chronic obstructive pulmonary disease)     per CT scan severe fibrosis, pt is asymptomatic  . Spinal stenosis     L3-L4  . Myocardial infarction 1996    s/p CABG  . Senile dementia, uncomplicated   . Hypertrophy of prostate without urinary obstruction and other lower urinary tract symptoms (LUTS)   . Restless legs syndrome (RLS)   . Abdominal aneurysm without mention of rupture   . Other dyspnea and respiratory abnormality   . Slowing of urinary stream    Past Surgical History  Procedure Laterality Date  . Lumbar laminectomy      L4-L5  . Coronary artery bypass  graft  1998   Social History:   reports that he quit smoking about 58 years ago. His smoking use included Cigarettes. He smoked 0.00 packs per day. He does not have any smokeless tobacco history on file. He reports that  drinks alcohol. He reports that he does not use illicit drugs.  Family History  Problem Relation Age of Onset  . Heart disease Son   . Heart disease Father   . Heart disease Brother     Medications: Patient's Medications  New Prescriptions   No medications on file  Previous Medications   ASPIRIN 81 MG EC TABLET    Take 81 mg by mouth 2 (two) times daily.    Modified Medications   Modified Medication Previous Medication   ROPINIROLE (REQUIP) 1 MG TABLET rOPINIRole (REQUIP) 1 MG tablet      Take one tablet every 6 hours as needed for restless leg syndrome.    Take one tablet every 8 hours for restless leg syndrome.  Discontinued Medications   MEMANTINE HCL ER (NAMENDA XR) 28 MG CP24    Take by mouth. Take one tablet once daily to preserve memory.     Physical Exam: Physical Exam  Constitutional: He is oriented to person, place, and time. He appears well-developed and well-nourished. No distress.  HENT:  Head: Normocephalic and atraumatic.  Eyes: EOM are normal. Pupils are equal, round, and reactive to light.  Neck: Normal range of motion. Neck supple.  Cardiovascular: Normal rate and regular rhythm.   Murmur heard. Pulmonary/Chest: Effort normal and breath sounds normal. No respiratory distress.  Abdominal: Soft. Bowel sounds are normal. He exhibits abdominal bruit and pulsatile midline mass.  Musculoskeletal: Normal range of motion. He exhibits no edema and no tenderness.  Neurological: He is alert and oriented to person, place, and time.  Skin: Skin is warm and dry. He is not diaphoretic.  Psychiatric: He has a normal mood and affect.    Filed Vitals:   10/15/12 1023  BP: 118/58  Pulse: 66  Temp: 96.1 F (35.6 C)  Resp: 16  Height: 5\' 9"  (1.753 m)   Weight: 167 lb 12.8 oz (76.114 kg)      Labs reviewed: Does not wish to have any lab work done    Assessment/Plan ABDOMINAL AORTIC ANEURYSM Unchanged- does not wish to have any additional studies done  Unspecified constipation Can try Colace (stool softener) 100 mg 1 to 2 times daily can start with 2 daily and can decrease to 1, and benefiber or metamucil mixed into drink- 1 to 2 times daily. Miralax 17gm into drink daily if needed for constipation. Increase water intake   Restless leg Try tonic water to help with restless leg Will increase requip q 6 hours as needed     To follow up in office as needed

## 2012-10-15 NOTE — Assessment & Plan Note (Signed)
Try tonic water to help with restless leg Will increase requip q 6 hours as needed

## 2012-10-15 NOTE — Patient Instructions (Addendum)
FOR CONSTIPATION  Colace (stool softener) 100 mg 1 to 2 times daily-- would start with 2 daily and can decrease to 1  benefiber or metamucil mixed into drink- 1 to 2 times daily-- start with 1 daily  Miralax 17gm into drink daily if needed for constipation -- can try if MOM does not work  For Restless Leg- may try tonic water   Constipation, Adult Constipation is when a person has fewer than 3 bowel movements a week; has difficulty having a bowel movement; or has stools that are dry, hard, or larger than normal. As people grow older, constipation is more common. If you try to fix constipation with medicines that make you have a bowel movement (laxatives), the problem may get worse. Long-term laxative use may cause the muscles of the colon to become weak. A low-fiber diet, not taking in enough fluids, and taking certain medicines may make constipation worse. CAUSES   Certain medicines, such as antidepressants, pain medicine, iron supplements, antacids, and water pills.   Certain diseases, such as diabetes, irritable bowel syndrome (IBS), thyroid disease, or depression.   Not drinking enough water.   Not eating enough fiber-rich foods.   Stress or travel.  Lack of physical activity or exercise.  Not going to the restroom when there is the urge to have a bowel movement.  Ignoring the urge to have a bowel movement.  Using laxatives too much. SYMPTOMS   Having fewer than 3 bowel movements a week.   Straining to have a bowel movement.   Having hard, dry, or larger than normal stools.   Feeling full or bloated.   Pain in the lower abdomen.  Not feeling relief after having a bowel movement. DIAGNOSIS  Your caregiver will take a medical history and perform a physical exam. Further testing may be done for severe constipation. Some tests may include:   A barium enema X-ray to examine your rectum, colon, and sometimes, your small intestine.  A sigmoidoscopy to examine  your lower colon.  A colonoscopy to examine your entire colon. TREATMENT  Treatment will depend on the severity of your constipation and what is causing it. Some dietary treatments include drinking more fluids and eating more fiber-rich foods. Lifestyle treatments may include regular exercise. If these diet and lifestyle recommendations do not help, your caregiver may recommend taking over-the-counter laxative medicines to help you have bowel movements. Prescription medicines may be prescribed if over-the-counter medicines do not work.  HOME CARE INSTRUCTIONS   Increase dietary fiber in your diet, such as fruits, vegetables, whole grains, and beans. Limit high-fat and processed sugars in your diet, such as [place] fries, hamburgers, cookies, candies, and soda.   A fiber supplement may be added to your diet if you cannot get enough fiber from foods.   Drink enough fluids to keep your urine clear or pale yellow.   Exercise regularly or as directed by your caregiver.   Go to the restroom when you have the urge to go. Do not hold it.  Only take medicines as directed by your caregiver. Do not take other medicines for constipation without talking to your caregiver first. SEEK IMMEDIATE MEDICAL CARE IF:   You have bright red blood in your stool.   Your constipation lasts for more than 4 days or gets worse.   You have abdominal or rectal pain.   You have thin, pencil-like stools.  You have unexplained weight loss. MAKE SURE YOU:   Understand these instructions.  Will watch your  condition.  Will get help right away if you are not doing well or get worse. Document Released: 02/26/2004 Document Revised: 08/22/2011 Document Reviewed: 05/03/2011 [facility] Patient Information 2013 [place], [place].   Restless Legs Syndrome Restless legs syndrome is a movement disorder. It may also be called a sensori-motor disorder.  CAUSES  No one knows what specifically causes restless legs  syndrome, but it tends to run in families. It is also more common in people with low iron, in pregnancy, in people who need dialysis, and those with nerve damage (neuropathy).Some medications may make restless legs syndrome worse.Those medications include drugs to treat high blood pressure, some heart conditions, nausea, colds, allergies, and depression. SYMPTOMS Symptoms include uncomfortable sensations in the legs. These leg sensations are worse during periods of inactivity or rest. They are also worse while sitting or lying down. Individuals that have the disorder describe sensations in the legs that feel like:  Pulling.  Drawing.  Crawling.  Worming.  Boring.  Tingling.  Pins and needles.  Prickling.  Pain. The sensations are usually accompanied by an overwhelming urge to move the legs. Sudden muscle jerks may also occur. Movement provides temporary relief from the discomfort. In rare cases, the arms may also be affected. Symptoms may interfere with going to sleep (sleep onset insomnia). Restless legs syndrome may also be related to periodic limb movement disorder (PLMD). PLMD is another more common motor disorder. It also causes interrupted sleep. The symptoms from PLMD usually occur most often when you are awake. TREATMENT  Treatment for restless legs syndrome is symptomatic. This means that the symptoms are treated.   Massage and cold compresses may provide temporary relief.  Walk, stretch, or take a cold or hot bath.  Get regular exercise and a good night's sleep.  Avoid caffeine, alcohol, nicotine, and medications that can make it worse.  Do activities that provide mental stimulation like discussions, needlework, and video games. These may be helpful if you are not able to walk or stretch. Some medications are effective in relieving the symptoms. However, many of these medications have side effects. Ask your caregiver about medications that may help your symptoms.  Correcting iron deficiency may improve symptoms for some patients. Document Released: 05/20/2002 Document Revised: 08/22/2011 Document Reviewed: 08/26/2010 [facility] Patient Information 2013 [place], [place].

## 2012-10-15 NOTE — Assessment & Plan Note (Signed)
Can try Colace (stool softener) 100 mg 1 to 2 times daily can start with 2 daily and can decrease to 1, and benefiber or metamucil mixed into drink- 1 to 2 times daily. Miralax 17gm into drink daily if needed for constipation. Increase water intake

## 2013-01-17 ENCOUNTER — Ambulatory Visit (INDEPENDENT_AMBULATORY_CARE_PROVIDER_SITE_OTHER): Payer: Medicare Other | Admitting: Nurse Practitioner

## 2013-01-17 ENCOUNTER — Encounter: Payer: Self-pay | Admitting: Nurse Practitioner

## 2013-01-17 VITALS — BP 140/74 | HR 78 | Temp 98.2°F | Resp 18 | Ht 69.0 in | Wt 167.8 lb

## 2013-01-17 DIAGNOSIS — M25559 Pain in unspecified hip: Secondary | ICD-10-CM

## 2013-01-17 DIAGNOSIS — M25552 Pain in left hip: Secondary | ICD-10-CM

## 2013-01-17 NOTE — Patient Instructions (Addendum)
May use aleve twice daily for a few days to help with inflammation; ice and heat Tylenol 650 mg every 6 hours as needed for pain Will get xrays today

## 2013-01-17 NOTE — Progress Notes (Signed)
Patient ID: Vernon English, male   DOB: 1915/08/19, 79 y.o.   MRN: [id number]   No Known Allergies  Chief Complaint  Patient presents with  . Acute Visit    Larey Seat 2-3 days ago, bruise on left hip and femur    HPI: Patient is a 79 y.o. male seen in the office today for s/p fall. Got up at early morning to go bathroom and stumbed and fell. Hit the floor with left hip. 3 days ago.  Did not lose consciousness, did not hit head. No pain other than in left hip. That day couldn't walk, now he has increased pain when walking. Son would like an xray.    Review of Systems:  Review of Systems  Constitutional: Negative for fever, chills and malaise/fatigue.  Respiratory: Negative for cough.   Cardiovascular: Negative for chest pain and orthopnea.  Genitourinary: Negative for dysuria, urgency and frequency.  Musculoskeletal: Positive for joint pain (pain in hip) and falls.  Skin: Negative.   Neurological: Negative for weakness.     Past Medical History  Diagnosis Date  . Hypertension   . Hypothyroidism   . Abdominal aortic aneurysm 2006    3.9x3.6 cm per CT scan 2006  . Carotid artery stenosis   . COPD (chronic obstructive pulmonary disease)     per CT scan severe fibrosis, pt is asymptomatic  . Spinal stenosis     L3-L4  . Myocardial infarction 1996    s/p CABG  . Senile dementia, uncomplicated   . Hypertrophy of prostate without urinary obstruction and other lower urinary tract symptoms (LUTS)   . Restless legs syndrome (RLS)   . Abdominal aneurysm without mention of rupture   . Other dyspnea and respiratory abnormality   . Slowing of urinary stream    Past Surgical History  Procedure Laterality Date  . Lumbar laminectomy      L4-L5  . Coronary artery bypass graft  1998   Social History:   reports that he quit smoking about 58 years ago. His smoking use included Cigarettes. He smoked 0.00 packs per day. He does not have any smokeless tobacco history on file. He reports that   drinks alcohol. He reports that he does not use illicit drugs.  Family History  Problem Relation Age of Onset  . Heart disease Son   . Heart disease Father   . Heart disease Brother     Medications: Patient's Medications  New Prescriptions   No medications on file  Previous Medications   ASPIRIN 81 MG EC TABLET    Take 81 mg by mouth 2 (two) times daily.     ROPINIROLE (REQUIP) 1 MG TABLET    Take one tablet every 6 hours as needed for restless leg syndrome.  Modified Medications   No medications on file  Discontinued Medications   No medications on file     Physical Exam:  Filed Vitals:   01/17/13 1224  BP: 140/74  Pulse: 78  Temp: 98.2 F (36.8 C)  TempSrc: Oral  Resp: 18  Height: 5\' 9"  (1.753 m)  Weight: 167 lb 12.8 oz (76.114 kg)  SpO2: 95%    Physical Exam  Constitutional: He is well-developed, well-nourished, and in no distress. No distress.  Cardiovascular: Normal rate, regular rhythm and normal heart sounds.   Pulmonary/Chest: Effort normal and breath sounds normal.  Abdominal: Soft. Bowel sounds are normal.  Musculoskeletal: Normal range of motion. He exhibits no edema and no tenderness.  Left hip: He exhibits normal range of motion, normal strength, no tenderness and no swelling.       Left knee: He exhibits normal range of motion and no swelling.  Neurological: He is alert.  Skin: Skin is warm and dry. He is not diaphoretic.      Assessment/Plan. 1. Hip pain, acute, left Pt sp fall; with minimal pain and good ROM however will get xrays to rule out fx may  May use aleve twice daily for a few days to help with inflammation; ice and heat and tylenol 650 mg every 6 hours as needed for pain Discussed HH therapy to eval and treat due to fall however son does not feel this is necessary at this time. Will reassess if needed later

## 2013-04-30 ENCOUNTER — Telehealth: Payer: Self-pay | Admitting: *Deleted

## 2013-04-30 NOTE — Telephone Encounter (Signed)
Son,Jeff,Wants to change Rx to something cheaper for his dads nervous leg. Taking Ropinol 1mg  now and too expensive. Would like to change to Mirapex 0.125mg  (pramipexale) generic. Would like to try this instead see if it would be cheaper and work. Daughter is taking this and it works with her and would like to try.

## 2013-05-01 ENCOUNTER — Other Ambulatory Visit: Payer: Self-pay | Admitting: *Deleted

## 2013-05-01 MED ORDER — PRAMIPEXOLE DIHYDROCHLORIDE 0.125 MG PO TABS: ORAL_TABLET | ORAL | Status: AC

## 2013-05-01 NOTE — Telephone Encounter (Signed)
Patient son, Vernon English, Notified and faxed Rx into [facility] on [address].

## 2013-05-01 NOTE — Telephone Encounter (Signed)
Okay but will have to be once daily dosing at night  Parmipexole 0.125 mg PO qhs

## 2013-08-25 ENCOUNTER — Emergency Department (HOSPITAL_COMMUNITY): Payer: Medicare Other

## 2013-08-25 ENCOUNTER — Inpatient Hospital Stay (HOSPITAL_COMMUNITY)
Admission: EM | Admit: 2013-08-25 | Discharge: 2013-08-30 | DRG: 280 | Disposition: A | Discharge status: Hospice | Payer: Medicare Other | Attending: Internal Medicine | Admitting: Internal Medicine

## 2013-08-25 ENCOUNTER — Encounter (HOSPITAL_COMMUNITY): Payer: Self-pay | Admitting: Emergency Medicine

## 2013-08-25 DIAGNOSIS — I6529 Occlusion and stenosis of unspecified carotid artery: Secondary | ICD-10-CM | POA: Diagnosis present

## 2013-08-25 DIAGNOSIS — R079 Chest pain, unspecified: Secondary | ICD-10-CM

## 2013-08-25 DIAGNOSIS — Z515 Encounter for palliative care: Secondary | ICD-10-CM

## 2013-08-25 DIAGNOSIS — D649 Anemia, unspecified: Secondary | ICD-10-CM | POA: Diagnosis present

## 2013-08-25 DIAGNOSIS — I714 Abdominal aortic aneurysm, without rupture, unspecified: Secondary | ICD-10-CM | POA: Diagnosis present

## 2013-08-25 DIAGNOSIS — E039 Hypothyroidism, unspecified: Secondary | ICD-10-CM | POA: Diagnosis present

## 2013-08-25 DIAGNOSIS — Z951 Presence of aortocoronary bypass graft: Secondary | ICD-10-CM

## 2013-08-25 DIAGNOSIS — R778 Other specified abnormalities of plasma proteins: Secondary | ICD-10-CM

## 2013-08-25 DIAGNOSIS — Z87891 Personal history of nicotine dependence: Secondary | ICD-10-CM

## 2013-08-25 DIAGNOSIS — Z9181 History of falling: Secondary | ICD-10-CM

## 2013-08-25 DIAGNOSIS — R5381 Other malaise: Secondary | ICD-10-CM | POA: Diagnosis present

## 2013-08-25 DIAGNOSIS — I080 Rheumatic disorders of both mitral and aortic valves: Secondary | ICD-10-CM | POA: Diagnosis present

## 2013-08-25 DIAGNOSIS — Z79899 Other long term (current) drug therapy: Secondary | ICD-10-CM

## 2013-08-25 DIAGNOSIS — E870 Hyperosmolality and hypernatremia: Secondary | ICD-10-CM | POA: Diagnosis present

## 2013-08-25 DIAGNOSIS — K5900 Constipation, unspecified: Secondary | ICD-10-CM | POA: Diagnosis present

## 2013-08-25 DIAGNOSIS — I350 Nonrheumatic aortic (valve) stenosis: Secondary | ICD-10-CM | POA: Diagnosis present

## 2013-08-25 DIAGNOSIS — E785 Hyperlipidemia, unspecified: Secondary | ICD-10-CM | POA: Diagnosis present

## 2013-08-25 DIAGNOSIS — I214 Non-ST elevation (NSTEMI) myocardial infarction: Secondary | ICD-10-CM | POA: Diagnosis present

## 2013-08-25 DIAGNOSIS — J9691 Respiratory failure, unspecified with hypoxia: Secondary | ICD-10-CM

## 2013-08-25 DIAGNOSIS — F0390 Unspecified dementia without behavioral disturbance: Secondary | ICD-10-CM | POA: Diagnosis present

## 2013-08-25 DIAGNOSIS — G2581 Restless legs syndrome: Secondary | ICD-10-CM | POA: Diagnosis present

## 2013-08-25 DIAGNOSIS — I252 Old myocardial infarction: Secondary | ICD-10-CM

## 2013-08-25 DIAGNOSIS — I4891 Unspecified atrial fibrillation: Secondary | ICD-10-CM | POA: Diagnosis present

## 2013-08-25 DIAGNOSIS — I712 Thoracic aortic aneurysm, without rupture, unspecified: Secondary | ICD-10-CM | POA: Diagnosis present

## 2013-08-25 DIAGNOSIS — I5023 Acute on chronic systolic (congestive) heart failure: Secondary | ICD-10-CM | POA: Diagnosis present

## 2013-08-25 DIAGNOSIS — I5021 Acute systolic (congestive) heart failure: Secondary | ICD-10-CM

## 2013-08-25 DIAGNOSIS — J9600 Acute respiratory failure, unspecified whether with hypoxia or hypercapnia: Secondary | ICD-10-CM | POA: Diagnosis present

## 2013-08-25 DIAGNOSIS — I10 Essential (primary) hypertension: Secondary | ICD-10-CM | POA: Diagnosis present

## 2013-08-25 DIAGNOSIS — R7989 Other specified abnormal findings of blood chemistry: Secondary | ICD-10-CM

## 2013-08-25 DIAGNOSIS — Z66 Do not resuscitate: Secondary | ICD-10-CM | POA: Diagnosis present

## 2013-08-25 DIAGNOSIS — Z8249 Family history of ischemic heart disease and other diseases of the circulatory system: Secondary | ICD-10-CM

## 2013-08-25 DIAGNOSIS — D7589 Other specified diseases of blood and blood-forming organs: Secondary | ICD-10-CM | POA: Diagnosis present

## 2013-08-25 DIAGNOSIS — R0600 Dyspnea, unspecified: Secondary | ICD-10-CM

## 2013-08-25 DIAGNOSIS — I959 Hypotension, unspecified: Secondary | ICD-10-CM | POA: Diagnosis present

## 2013-08-25 DIAGNOSIS — J441 Chronic obstructive pulmonary disease with (acute) exacerbation: Secondary | ICD-10-CM | POA: Diagnosis present

## 2013-08-25 LAB — CBC WITH DIFFERENTIAL/PLATELET
Basophils Absolute: 0 K/uL (ref 0.0–0.1)
Basophils Relative: 0 % (ref 0–1)
Eosinophils Absolute: 0.2 K/uL (ref 0.0–0.7)
Eosinophils Relative: 2 % (ref 0–5)
HCT: 39.5 % (ref 39.0–52.0)
Hemoglobin: 13 g/dL (ref 13.0–17.0)
Lymphocytes Relative: 21 % (ref 12–46)
Lymphs Abs: 1.9 K/uL (ref 0.7–4.0)
MCH: 34.7 pg — ABNORMAL HIGH (ref 26.0–34.0)
MCHC: 32.9 g/dL (ref 30.0–36.0)
MCV: 105.3 fL — ABNORMAL HIGH (ref 78.0–100.0)
Monocytes Absolute: 0.8 10*3/uL (ref 0.1–1.0)
Monocytes Relative: 9 % (ref 3–12)
Neutro Abs: 6.1 10*3/uL (ref 1.7–7.7)
Neutrophils Relative %: 68 % (ref 43–77)
Platelets: 165 K/uL (ref 150–400)
RBC: 3.75 MIL/uL — ABNORMAL LOW (ref 4.22–5.81)
RDW: 14.6 % (ref 11.5–15.5)
WBC: 9 10*3/uL (ref 4.0–10.5)

## 2013-08-25 LAB — BASIC METABOLIC PANEL
BUN: 28 mg/dL — ABNORMAL HIGH (ref 6–23)
CO2: 22 mEq/L (ref 19–32)
Chloride: 113 mEq/L — ABNORMAL HIGH (ref 96–112)
Creatinine, Ser: 1.17 mg/dL (ref 0.50–1.35)
GFR calc Af Amer: 59 mL/min — ABNORMAL LOW (ref >90)
Glucose, Bld: 125 mg/dL — ABNORMAL HIGH (ref 70–99)
Potassium: 4.3 mEq/L (ref 3.7–5.3)

## 2013-08-25 LAB — TROPONIN I
TROPONIN I: 2.81 ng/mL — AB (ref <0.30)
TROPONIN I: 7.27 ng/mL — AB (ref <0.30)

## 2013-08-25 LAB — I-STAT TROPONIN, ED: Troponin i, poc: 0.05 ng/mL (ref 0.00–0.08)

## 2013-08-25 LAB — TSH: TSH: 3.088 u[IU]/mL (ref 0.350–4.500)

## 2013-08-25 LAB — BASIC METABOLIC PANEL WITH GFR
Calcium: 9.1 mg/dL (ref 8.4–10.5)
GFR calc non Af Amer: 51 mL/min — ABNORMAL LOW (ref >90)
Sodium: 149 meq/L — ABNORMAL HIGH (ref 137–147)

## 2013-08-25 LAB — PROTIME-INR
INR: 1.16 (ref 0.00–1.49)
Prothrombin Time: 14.6 seconds (ref 11.6–15.2)

## 2013-08-25 LAB — APTT: aPTT: 41 s — ABNORMAL HIGH (ref 24–37)

## 2013-08-25 LAB — MRSA PCR SCREENING: MRSA by PCR: NEGATIVE

## 2013-08-25 MED ORDER — ONDANSETRON HCL 4 MG PO TABS: 4.0000 mg | ORAL_TABLET | Freq: Four times a day (QID) | ORAL | Status: DC | PRN

## 2013-08-25 MED ORDER — DEXTROSE 5 % IV SOLN
5.0000 mg/h | INTRAVENOUS | Status: DC
Start: 2013-08-25 — End: 2013-08-26
  Administered 2013-08-26: 5 mg/h via INTRAVENOUS

## 2013-08-25 MED ORDER — SODIUM CHLORIDE 0.9 % IV SOLN
INTRAVENOUS | Status: AC
  Administered 2013-08-25: 15:00:00 via INTRAVENOUS

## 2013-08-25 MED ORDER — ONDANSETRON HCL 4 MG/2ML IJ SOLN: 4.0000 mg | Freq: Four times a day (QID) | INTRAMUSCULAR | Status: DC | PRN

## 2013-08-25 MED ORDER — SODIUM CHLORIDE 0.9 % IV BOLUS (SEPSIS)
500.0000 mL | Freq: Once | INTRAVENOUS | Status: AC
  Administered 2013-08-25: 500 mL via INTRAVENOUS

## 2013-08-25 MED ORDER — PRAMIPEXOLE DIHYDROCHLORIDE 0.125 MG PO TABS
0.1250 mg | ORAL_TABLET | Freq: Every evening | ORAL | Status: DC | PRN
  Administered 2013-08-26: 0.125 mg via ORAL
  Filled 2013-08-25 (×2): qty 1

## 2013-08-25 MED ORDER — ROPINIROLE HCL 1 MG PO TABS
1.0000 mg | ORAL_TABLET | Freq: Once | ORAL | Status: AC
  Administered 2013-08-25: 1 mg via ORAL
  Filled 2013-08-25: qty 1

## 2013-08-25 MED ORDER — SODIUM CHLORIDE 0.9 % IJ SOLN
3.0000 mL | Freq: Two times a day (BID) | INTRAMUSCULAR | Status: DC
  Administered 2013-08-27 – 2013-08-30 (×5): 3 mL via INTRAVENOUS

## 2013-08-25 MED ORDER — HEPARIN (PORCINE) IN NACL 100-0.45 UNIT/ML-% IJ SOLN
1200.0000 [IU]/h | INTRAMUSCULAR | Status: DC
  Administered 2013-08-25 – 2013-08-26 (×2): 950 [IU]/h via INTRAVENOUS
  Filled 2013-08-25 (×4): qty 250

## 2013-08-25 MED ORDER — ENOXAPARIN SODIUM 40 MG/0.4ML ~~LOC~~ SOLN
40.0000 mg | SUBCUTANEOUS | Status: DC
  Administered 2013-08-25: 40 mg via SUBCUTANEOUS
  Filled 2013-08-25: qty 0.4

## 2013-08-25 MED ORDER — ROPINIROLE HCL 1 MG PO TABS
1.0000 mg | ORAL_TABLET | Freq: Every day | ORAL | Status: DC
  Administered 2013-08-25 – 2013-08-28 (×4): 1 mg via ORAL
  Filled 2013-08-25 (×4): qty 1

## 2013-08-25 MED ORDER — DILTIAZEM HCL 100 MG IV SOLR
5.0000 mg/h | Freq: Once | INTRAVENOUS | Status: AC
  Administered 2013-08-25: 5 mg/h via INTRAVENOUS

## 2013-08-25 NOTE — ED Notes (Signed)
Patient lives with son. Son states that patient recently had some trouble with constipation and found relief from that with OTC meds. Son states that patient has not felt himself since. States patient has had trouble with weakness in legs and trouble walking. Patient states he has had a "rough few nights" with chest pain and shortness of breath.

## 2013-08-25 NOTE — ED Notes (Signed)
Trey PaulaJeff (son) [phone] asks if we could please call him when pt is moved upstairs.

## 2013-08-25 NOTE — Progress Notes (Signed)
ANTICOAGULATION CONSULT NOTE - Initial Consult  Pharmacy Consult for heparin Indication: atrial fibrillation  No Known Allergies  Patient Measurements: Height: 6' (182.9 cm) Weight: 180 lb (81.647 kg) IBW/kg (Calculated) : 77.6 Heparin Dosing Weight: 80kg  Vital Signs: Temp: 98.1 F (36.7 C) (03/15 1557) Temp src: Oral (03/15 1557) BP: 107/77 mmHg (03/15 1557) Pulse Rate: 134 (03/15 1557)  Labs:  Recent Labs  08/25/13 0948 08/25/13 1555  HGB 13.0  --   HCT 39.5  --   PLT 165  --   APTT 41*  --   LABPROT 14.6  --   INR 1.16  --   CREATININE 1.17  --   TROPONINI  --  2.81*    Estimated Creatinine Clearance: 40.5 ml/min (by C-G formula based on Cr of 1.17).   Medical History: Past Medical History  Diagnosis Date  . Hypertension   . Hypothyroidism   . Abdominal aortic aneurysm 2006    3.9x3.6 cm per CT scan 2006  . Carotid artery stenosis   . COPD (chronic obstructive pulmonary disease)     per CT scan severe fibrosis, pt is asymptomatic  . Spinal stenosis     L3-L4  . Myocardial infarction 1996    s/p CABG  . Senile dementia, uncomplicated   . Hypertrophy of prostate without urinary obstruction and other lower urinary tract symptoms (LUTS)   . Restless legs syndrome (RLS)   . Abdominal aneurysm without mention of rupture   . Other dyspnea and respiratory abnormality   . Slowing of urinary stream    Assessment: 80 year old male with afib with rvr, not on anticoagulants. Orders to start IV heparin, received sq lovenox ~1700 today. Baseline coags within normal limits.  Goal of Therapy:  Heparin level 0.3-0.7 units/ml Monitor platelets by anticoagulation protocol: Yes   Plan:  Start heparin infusion at 950 units/hr Check anti-Xa level in 8 hours and daily while on heparin Continue to monitor H&H and platelets D/c sq lovenox  Vernon English, Vernon English 08/25/2013,5:35 PM

## 2013-08-25 NOTE — ED Notes (Addendum)
Dr Benjamine MolaVann at bedside.  Meal tray ordered.

## 2013-08-25 NOTE — ED Provider Notes (Signed)
CSN: [id number]     Arrival date & time 08/25/13  0920 History   First MD Initiated Contact with Patient 08/25/13 [phone]     Chief Complaint  Patient presents with  . Chest Pain     (Consider location/radiation/quality/duration/timing/severity/associated sxs/prior Treatment) HPI Comments: Pt has not been feeling well for 2-3 days according to the patient's son. Symptoms have been relatively nonspecific. Patient was recently constipated, improved over the last couple of days. Appetite and energy levels has been down. Since proximal he 7 PM last night, the patient has been complaining of worsening chest pain and shortness of breath. Symptoms have somewhat improved on her own, patient brought to the emergency department although he wanted to stay at home by the patient's son who lives with the patient. Patient found to be relatively hypotensive and tachycardic and brought immediately back. Patient reportedly has a history of paroxysmal atrial fibrillation. He used to be [facility], but has not been seen in some time. Currently only he is followed by his primary care physician, Dr. Purnell ShoemakerKaram. He has known abdominal aneurysms that are severe, not amenable to surgical treatment due to age and comorbidities. Patient is not currently on any antiarrhythmics, nor any anticoagulation. Level V caveat due to to patient acuity.  Patient is a 80 y.o. male presenting with chest pain. The history is provided by the patient and a relative. The history is limited by the condition of the patient.  Chest Pain   Past Medical History  Diagnosis Date  . Hypertension   . Hypothyroidism   . Abdominal aortic aneurysm 2006    3.9x3.6 cm per CT scan 2006  . Carotid artery stenosis   . COPD (chronic obstructive pulmonary disease)     per CT scan severe fibrosis, pt is asymptomatic  . Spinal stenosis     L3-L4  . Myocardial infarction 1996    s/p CABG  . Senile dementia,  uncomplicated   . Hypertrophy of prostate without urinary obstruction and other lower urinary tract symptoms (LUTS)   . Restless legs syndrome (RLS)   . Abdominal aneurysm without mention of rupture   . Other dyspnea and respiratory abnormality   . Slowing of urinary stream    Past Surgical History  Procedure Laterality Date  . Lumbar laminectomy      L4-L5  . Coronary artery bypass graft  1998   Family History  Problem Relation Age of Onset  . Heart disease Son   . Heart disease Father   . Heart disease Brother    History  Substance Use Topics  . Smoking status: Former Smoker    Types: Cigarettes    Quit date: 06/13/1954  . Smokeless tobacco: Not on file  . Alcohol Use: Yes     Comment: Glass of wine w/dinner    Review of Systems  Unable to perform ROS: Acuity of condition  Cardiovascular: Positive for chest pain.      Allergies  Review of patient's allergies indicates no known allergies.  Home Medications   Current Outpatient Rx  Name  Route  Sig  Dispense  Refill  . Multiple Vitamins-Minerals (CENTRUM SILVER PO)   Oral   Take 1 tablet by mouth daily.         . pramipexole (MIRAPEX) 0.125 MG tablet      Take one tablet by mouth at bedtime for leg pains   30 tablet   3   . rOPINIRole (REQUIP) 1 MG  tablet   Oral   Take 1 mg by mouth daily.          BP 92/64  Pulse 103  Temp(Src) 97.8 F (36.6 C) (Oral)  Resp 24  SpO2 94% Physical Exam  Nursing note and vitals reviewed. Constitutional: He is oriented to person, place, and time. He appears well-developed and well-nourished. He is cooperative. He appears ill. No distress.  HENT:  Head: Normocephalic and atraumatic.  Eyes: Conjunctivae and EOM are normal. No scleral icterus.  Neck: Normal range of motion. Neck supple. JVD present.  Cardiovascular: Intact distal pulses.   Pulmonary/Chest: Effort normal.  Abdominal: Soft. He exhibits no distension. There is no tenderness.  Neurological: He is  alert and oriented to person, place, and time. He has normal strength. He displays no atrophy and no tremor.  Skin: Skin is warm. No rash noted. He is not diaphoretic. No pallor.  Psychiatric: He has a normal mood and affect.    ED Course  Procedures (including critical care time)   CRITICAL CARE Performed by: Lear Ng. Total critical care time: 30 min Critical care time was exclusive of separately billable procedures and treating other patients. Critical care was necessary to treat or prevent imminent or life-threatening deterioration. Critical care was time spent personally by me on the following activities: development of treatment plan with patient and/or surrogate as well as nursing, discussions with consultants, evaluation of patient's response to treatment, examination of patient, obtaining history from patient or surrogate, ordering and performing treatments and interventions, ordering and review of laboratory studies, ordering and review of radiographic studies, pulse oximetry and re-evaluation of patient's condition.  Labs Review Labs Reviewed  CBC WITH DIFFERENTIAL - Abnormal; Notable for the following:    RBC 3.75 (*)    MCV 105.3 (*)    MCH 34.7 (*)    All other components within normal limits  BASIC METABOLIC PANEL - Abnormal; Notable for the following:    Sodium 149 (*)    Chloride 113 (*)    Glucose, Bld 125 (*)    BUN 28 (*)    GFR calc non Af Amer 51 (*)    GFR calc Af Amer 59 (*)    All other components within normal limits  APTT - Abnormal; Notable for the following:    aPTT 41 (*)    All other components within normal limits  PROTIME-INR  Rosezena Sensor, ED   Imaging Review Dg Chest Port 1 View  08/25/2013   CLINICAL DATA:  Left-sided chest pain and chronic shortness of breath with exertion. Remote CABG.  EXAM: PORTABLE CHEST - 1 VIEW  COMPARISON:  DG CHEST 1V PORT dated 08/08/2008; DG CHEST 2 VIEW dated 12/13/2004; CT CHEST W/CM dated 02/17/2005   FINDINGS: Evidence of CABG noted with moderate enlargement of cardiomediastinal silhouette. There are diffusely prominent interstitial reticular markings with suggestion of Kerley B lines and trace pleural effusions. No focal parenchymal consolidation.  IMPRESSION: New probable interstitial edema or atypical infection/inflammatory process. If symptoms persist, consider PA and lateral chest radiographs obtained at full inspiration when the patient is clinically able.   Electronically Signed   By: Christiana Pellant M.D.   On: 08/25/2013 11:55     EKG Interpretation, time 0926 Atrial fibrillation with RVR at rate 143, diffuse ST depression, LAD, poor r wave progression V1 to V3.  Abn ECG.         Room air saturation is 97% and I interpret to be adequate.  12:21 PM  Rate has improved, will go from 90's to 110's, BP improved.  Pt is hungry.  Pt and son agree with admission for symptoms and a fib.  Will continue diltizem drip, is DNR, consider step down admission.    12:31 PM Spoke to Dr. Benjamine Mola and family again.  Pt to be admitted to step down.   MDM   Final diagnoses:  Atrial fibrillation with RVR    Patient was relatively hypotensive in triage while sitting up. While lying in bed, patient's blood pressures improved, heart rate is elevated at 150s, irregular consistent with atrial fibrillation. There is also some associated ST depression suggestive of some cardiac ischemia which is not uncommon given his age. Patient and son report that the patient would not desire to be intubated or have CPR performed. However routine treatment including fluids and IV medications seemed to be okay according to the patient's family. My plan is to administer IV fluids for his tenuous blood pressure, trial of IV diltiazem to slow down the rate. Patient reports no current active chest pain, just continues to feel poorly in general.    Gavin Pound. Makella Buckingham, MD 08/25/13 1231

## 2013-08-25 NOTE — H&P (Signed)
[facility] History and Physical  Vernon English ZOX:[id number] DOB: 1915-12-08 DOA: 08/25/2013  Referring physician: er PCP: Lenoard Aden, NP   Chief Complaint: fatigue  HPI: Vernon English is a 80 y.o. male  Who is unable to provide any history, history gained from ER records.  ER states that the patient has said he's not been feeling well the last 2-3 days.. Patient has multiple complaints of constipation, decreased appetite, and decreased energy. Last night at 7 PM he developed chest pain shortness of breath. These have since resolved.  Patient lives at home with his son.  In the ER patient was found to be in atrial fibrillation with rapid ventricular rate. Patient followed with Dr. Clarene Duke at [facility]. Patient initially had a high heart rate and low blood pressure. This is improved with IV fluids and a Cardizem drip. Hospitalists were asked to admit for atrial fibrillation. This is not a new problem patient has paroxysmal A. Fib but is not on any anticoagulation nor rate controlling medicines.   Review of Systems:  all systems reviewed negative unless stated above   Past Medical History  Diagnosis Date  . Hypertension   . Hypothyroidism   . Abdominal aortic aneurysm 2006    3.9x3.6 cm per CT scan 2006  . Carotid artery stenosis   . COPD (chronic obstructive pulmonary disease)     per CT scan severe fibrosis, pt is asymptomatic  . Spinal stenosis     L3-L4  . Myocardial infarction 1996    s/p CABG  . Senile dementia, uncomplicated   . Hypertrophy of prostate without urinary obstruction and other lower urinary tract symptoms (LUTS)   . Restless legs syndrome (RLS)   . Abdominal aneurysm without mention of rupture   . Other dyspnea and respiratory abnormality   . Slowing of urinary stream    Past Surgical History  Procedure Laterality Date  . Lumbar laminectomy      L4-L5  . Coronary artery bypass graft  1998   Social  History:  reports that he quit smoking about 59 years ago. His smoking use included Cigarettes. He smoked 0.00 packs per day. He does not have any smokeless tobacco history on file. He reports that he drinks alcohol. He reports that he does not use illicit drugs.  No Known Allergies  Family History  Problem Relation Age of Onset  . Heart disease Son   . Heart disease Father   . Heart disease Brother      Prior to Admission medications   Medication Sig Start Date End Date Taking? Authorizing Provider  Multiple Vitamins-Minerals (CENTRUM SILVER PO) Take 1 tablet by mouth daily.   Yes Historical Provider, MD  pramipexole (MIRAPEX) 0.125 MG tablet Take one tablet by mouth at bedtime for leg pains 05/01/13  Yes Claudie Revering, NP  rOPINIRole (REQUIP) 1 MG tablet Take 1 mg by mouth daily.   Yes Historical Provider, MD   Physical Exam: Filed Vitals:   08/25/13 1245  BP: 99/62  Pulse: 113  Temp:   Resp: 21    BP 99/62  Pulse 113  Temp(Src) 97.8 F (36.6 C) (Oral)  Resp 21  SpO2 91%  General:  Appears calm and comfortable, NAD Eyes: PERRL, normal lids, irises & conjunctiva ENT: grossly normal hearing, lips & tongue Neck: no LAD, masses or thyromegaly Cardiovascular: irregular, + murmur Respiratory: CTA bilaterally, no w/r/r. Normal respiratory effort. Abdomen: soft, ntnd Skin: no rash or induration seen on  limited exam Musculoskeletal: grossly normal tone BUE/BLE Psychiatric: grossly normal mood and affect, speech fluent and appropriate Neurologic: grossly non-focal.          Labs on Admission:  Basic Metabolic Panel:  Recent Labs Lab 08/25/13 0948  NA 149*  K 4.3  CL 113*  CO2 22  GLUCOSE 125*  BUN 28*  CREATININE 1.17  CALCIUM 9.1   Liver Function Tests: No results found for this basename: AST, ALT, ALKPHOS, BILITOT, PROT, ALBUMIN,  in the last 168 hours No results found for this basename: LIPASE, AMYLASE,  in the last 168 hours No results found for this  basename: AMMONIA,  in the last 168 hours CBC:  Recent Labs Lab 08/25/13 0948  WBC 9.0  NEUTROABS 6.1  HGB 13.0  HCT 39.5  MCV 105.3*  PLT 165   Cardiac Enzymes: No results found for this basename: CKTOTAL, CKMB, CKMBINDEX, TROPONINI,  in the last 168 hours  BNP (last 3 results) No results found for this basename: PROBNP,  in the last 8760 hours CBG: No results found for this basename: GLUCAP,  in the last 168 hours  Radiological Exams on Admission: Dg Chest Port 1 View  08/25/2013   CLINICAL DATA:  Left-sided chest pain and chronic shortness of breath with exertion. Remote CABG.  EXAM: PORTABLE CHEST - 1 VIEW  COMPARISON:  DG CHEST 1V PORT dated 08/08/2008; DG CHEST 2 VIEW dated 12/13/2004; CT CHEST W/CM dated 02/17/2005  FINDINGS: Evidence of CABG noted with moderate enlargement of cardiomediastinal silhouette. There are diffusely prominent interstitial reticular markings with suggestion of Kerley B lines and trace pleural effusions. No focal parenchymal consolidation.  IMPRESSION: New probable interstitial edema or atypical infection/inflammatory process. If symptoms persist, consider PA and lateral chest radiographs obtained at full inspiration when the patient is clinically able.   Electronically Signed   By: Christiana PellantGretchen  Green M.D.   On: 08/25/2013 11:55    EKG: Independently reviewed. A fib with RVR  Assessment/Plan Active Problems:   HYPOTHYROIDISM   HYPERTENSION   Atrial fibrillation with RVR   Hypernatremia   A fib with RVR- IV Cardizem, SDU admission, TSH check, echo, cycle CE- cards consult if not easily controlled  Hypernatremia- IVF, recheck in Am  Weakness/fatigue- most like due to a fib but check U/A and TSH, PT consult once HR controlled  Hypothyroid- heck tsh      Code Status: DNR per ER discussions Family Communication: no family available Disposition Plan:   Time spent: 75 min  Marlin CanaryVANN, Nelida Mandarino [facility] Pager [phone]

## 2013-08-25 NOTE — ED Notes (Signed)
Dr Oletta LamasGhim is aware of pt vs and has ordered fluid bolus.

## 2013-08-25 NOTE — ED Notes (Signed)
Pt reports chest pain and sob all night, unable to sleep. afib rvr at triage, HR 149. bp 74/59.

## 2013-08-26 ENCOUNTER — Inpatient Hospital Stay (HOSPITAL_COMMUNITY): Payer: Medicare Other

## 2013-08-26 DIAGNOSIS — I214 Non-ST elevation (NSTEMI) myocardial infarction: Secondary | ICD-10-CM | POA: Diagnosis present

## 2013-08-26 DIAGNOSIS — D7589 Other specified diseases of blood and blood-forming organs: Secondary | ICD-10-CM | POA: Diagnosis present

## 2013-08-26 DIAGNOSIS — I359 Nonrheumatic aortic valve disorder, unspecified: Secondary | ICD-10-CM

## 2013-08-26 DIAGNOSIS — D649 Anemia, unspecified: Secondary | ICD-10-CM | POA: Diagnosis present

## 2013-08-26 DIAGNOSIS — R799 Abnormal finding of blood chemistry, unspecified: Secondary | ICD-10-CM

## 2013-08-26 DIAGNOSIS — R079 Chest pain, unspecified: Secondary | ICD-10-CM | POA: Diagnosis present

## 2013-08-26 DIAGNOSIS — I4891 Unspecified atrial fibrillation: Principal | ICD-10-CM

## 2013-08-26 DIAGNOSIS — J9691 Respiratory failure, unspecified with hypoxia: Secondary | ICD-10-CM | POA: Diagnosis present

## 2013-08-26 LAB — COMPREHENSIVE METABOLIC PANEL
ALBUMIN: 3.1 g/dL — AB (ref 3.5–5.2)
ALK PHOS: 76 U/L (ref 39–117)
ALT: 18 U/L (ref 0–53)
AST: 53 U/L — ABNORMAL HIGH (ref 0–37)
BUN: 28 mg/dL — ABNORMAL HIGH (ref 6–23)
CO2: 24 mEq/L (ref 19–32)
Calcium: 8.7 mg/dL (ref 8.4–10.5)
Chloride: 112 mEq/L (ref 96–112)
Creatinine, Ser: 1.08 mg/dL (ref 0.50–1.35)
GFR calc Af Amer: 65 mL/min — ABNORMAL LOW (ref >90)
GFR calc non Af Amer: 56 mL/min — ABNORMAL LOW (ref >90)
GLUCOSE: 119 mg/dL — AB (ref 70–99)
POTASSIUM: 4.5 meq/L (ref 3.7–5.3)
SODIUM: 147 meq/L (ref 137–147)
Total Bilirubin: 1.2 mg/dL (ref 0.3–1.2)
Total Protein: 6.4 g/dL (ref 6.0–8.3)

## 2013-08-26 LAB — URINALYSIS, ROUTINE W REFLEX MICROSCOPIC
Bilirubin Urine: NEGATIVE
Glucose, UA: NEGATIVE mg/dL
HGB URINE DIPSTICK: NEGATIVE
Ketones, ur: NEGATIVE mg/dL
Nitrite: NEGATIVE
Protein, ur: 30 mg/dL — AB
SPECIFIC GRAVITY, URINE: 1.029 (ref 1.005–1.030)
Urobilinogen, UA: 0.2 mg/dL (ref 0.0–1.0)
pH: 5 (ref 5.0–8.0)

## 2013-08-26 LAB — CBC
HEMATOCRIT: 38.3 % — AB (ref 39.0–52.0)
Hemoglobin: 12.4 g/dL — ABNORMAL LOW (ref 13.0–17.0)
MCH: 34 pg (ref 26.0–34.0)
MCHC: 32.4 g/dL (ref 30.0–36.0)
MCV: 104.9 fL — AB (ref 78.0–100.0)
Platelets: 158 10*3/uL (ref 150–400)
RBC: 3.65 MIL/uL — ABNORMAL LOW (ref 4.22–5.81)
RDW: 14.7 % (ref 11.5–15.5)
WBC: 9 10*3/uL (ref 4.0–10.5)

## 2013-08-26 LAB — HEPARIN LEVEL (UNFRACTIONATED)
HEPARIN UNFRACTIONATED: 0.43 [IU]/mL (ref 0.30–0.70)
Heparin Unfractionated: 0.25 IU/mL — ABNORMAL LOW (ref 0.30–0.70)

## 2013-08-26 LAB — URINE MICROSCOPIC-ADD ON

## 2013-08-26 LAB — TROPONIN I: Troponin I: 7.89 ng/mL (ref <0.30)

## 2013-08-26 MED ORDER — DILTIAZEM HCL 100 MG IV SOLR
5.0000 mg/h | INTRAVENOUS | Status: AC
  Filled 2013-08-26: qty 100

## 2013-08-26 MED ORDER — ASPIRIN 81 MG PO CHEW
81.0000 mg | CHEWABLE_TABLET | Freq: Every day | ORAL | Status: DC
Start: 2013-08-26 — End: 2013-08-28
  Administered 2013-08-26 – 2013-08-28 (×3): 81 mg via ORAL
  Filled 2013-08-26 (×3): qty 1

## 2013-08-26 MED ORDER — ACETAMINOPHEN 325 MG PO TABS
650.0000 mg | ORAL_TABLET | Freq: Four times a day (QID) | ORAL | Status: DC | PRN
  Administered 2013-08-26: 650 mg via ORAL
  Filled 2013-08-26: qty 2

## 2013-08-26 MED ORDER — SODIUM CHLORIDE 0.9 % IV SOLN
INTRAVENOUS | Status: DC
  Administered 2013-08-26: 10 mL/h via INTRAVENOUS

## 2013-08-26 MED ORDER — METOPROLOL TARTRATE 12.5 MG HALF TABLET
12.5000 mg | ORAL_TABLET | Freq: Two times a day (BID) | ORAL | Status: DC
  Administered 2013-08-26 (×2): 12.5 mg via ORAL
  Filled 2013-08-26 (×4): qty 1

## 2013-08-26 MED ORDER — LEVALBUTEROL HCL 1.25 MG/0.5ML IN NEBU
1.2500 mg | INHALATION_SOLUTION | Freq: Four times a day (QID) | RESPIRATORY_TRACT | Status: DC
  Filled 2013-08-26 (×4): qty 0.5

## 2013-08-26 MED ORDER — SIMVASTATIN 20 MG PO TABS
20.0000 mg | ORAL_TABLET | Freq: Every day | ORAL | Status: DC
  Administered 2013-08-26 – 2013-08-27 (×2): 20 mg via ORAL
  Filled 2013-08-26 (×3): qty 1

## 2013-08-26 MED ORDER — LEVALBUTEROL HCL 1.25 MG/0.5ML IN NEBU
1.2500 mg | INHALATION_SOLUTION | Freq: Four times a day (QID) | RESPIRATORY_TRACT | Status: DC
  Administered 2013-08-26: 1.25 mg via RESPIRATORY_TRACT
  Filled 2013-08-26 (×3): qty 0.5

## 2013-08-26 NOTE — Progress Notes (Signed)
ANTICOAGULATION CONSULT NOTE - Follow Up Consult  Pharmacy Consult for heparin Indication: ACS, afib  No Known Allergies  Patient Measurements: Height: 6' (182.9 cm) Weight: 167 lb 15.9 oz (76.2 kg) IBW/kg (Calculated) : 77.6  Vital Signs: Temp: 97.4 F (36.3 C) (03/16 1139) Temp src: Oral (03/16 1139) BP: 123/81 mmHg (03/16 1200) Pulse Rate: 90 (03/16 1139)  Labs:  Recent Labs  08/25/13 0948 08/25/13 1555 08/25/13 2200 08/26/13 0250 08/26/13 1050  HGB 13.0  --   --  12.4*  --   HCT 39.5  --   --  38.3*  --   PLT 165  --   --  158  --   APTT 41*  --   --   --   --   LABPROT 14.6  --   --   --   --   INR 1.16  --   --   --   --   HEPARINUNFRC  --   --   --  0.43 0.25*  CREATININE 1.17  --   --  1.08  --   TROPONINI  --  2.81* 7.27* 7.89*  --     Estimated Creatinine Clearance: 43.1 ml/min (by C-G formula based on Cr of 1.08).   Medications:  Scheduled:  . aspirin  81 mg Oral Daily  . metoprolol tartrate  12.5 mg Oral BID  . rOPINIRole  1 mg Oral Daily  . simvastatin  20 mg Oral q1800  . sodium chloride  3 mL Intravenous Q12H   Infusions:  . sodium chloride 20 mL/hr at 08/26/13 1251  . heparin 950 Units/hr (08/25/13 1824)    Assessment: [age] you male with afib and ACS (troponin= 7.89) and patient on heparin at 950 units/hr with heparin level now below goal (HL= 0.25).    Goal of Therapy:  Heparin level 0.3-0.7 units/ml Monitor platelets by anticoagulation protocol: Yes   Plan:  -Increase heparin to 1100 units/hr -Heparin level and CBC in am  Harland GermanAndrew Josiah Wojtaszek, Pharm D 08/26/2013 1:32 PM

## 2013-08-26 NOTE — Progress Notes (Signed)
Echo Lab  2D Echocardiogram completed.  Vernon English L Erdem Naas, RDCS 08/26/2013 3:20 PM

## 2013-08-26 NOTE — Plan of Care (Signed)
Problem: Phase I Progression Outcomes Goal: Initial discharge plan identified Outcome: Completed/Met Date Met:  08/26/13 Home with son  Problem: Phase II Progression Outcomes Goal: Discharge plan established Outcome: Completed/Met Date Met:  08/26/13 Home with son

## 2013-08-26 NOTE — Progress Notes (Signed)
[facility]  Vernon RiasCharles G English [id number] DOB: 12/26/1915 DOA: 08/25/2013 PCP: Lenoard AdenKaram, Jessica Marie, NP  Brief narrative: 80 year old male patient who endorsed feeling poorly for 2-3 days prior to presentation. Patient also endorsed multiple complaints of constipation, decreased appetite and decreased energy. At 7 PM the night prior to presentation patient developed chest pain or shortness of breath which eventually resolved.  After presenting to the ER he was found to be in atrial fibrillation with rapid ventricular response. His primary cardiologist is [facility]. In addition to have an tachycardia patient was hypotensive at presentation. This improved with the addition of IV fluids and a Cardizem drip.  Assessment/Plan: Active Problems:   Atrial fibrillation with RVR -Remains in atrial fibrillation but rate currently control -Attempt to transition to oral beta blocker from Cardizem infusion -Continue IV heparin for now -Unclear if appropriate candidate for chronic anticoagulation given abdominal aortic aneurysm- not a high fall risk as son home at all time and assists pt -Consult cardiology    Respiratory failure with hypoxia -Chest x-ray done at admission question interstitial edema -Did seem dehydrated at presentation but given abnormal chest x-ray in setting of atrial fibrillation and possible recent tachycardia induced cardiomyopathy we'll decrease IV fluids to keep open -Continue supportive care and wean oxygen as tolerated -No fever or cough so doubt infectious etiology    Elevated troponin/ Chest pain -Troponin thus far has peaked at greater than 7.0-repeat in a.m. -Discussed with patient and son and no desire to pursue invasive workup -Begin medical therapy: Beta blocker, aspirin and statin -Followup on 2-D echocardiogram-last echocardiogram in 2010 pressure of LV function, moderate aortic valve  stenosis with mild aortic valve regurgitation and moderate mitral stenosis with mild valvular regurgitant   Cardiac murmur -As above patient with known aortic stenosis and mitral stenosis -Echo repeated this admission    ABDOMINAL AORTIC ANEURYSM -Last imaging in 2006 and at that time infrarenal abdominal aneurysm measured 3.9 x 3.6 cm -Obtain abdominal ultrasound for imaging this admission    HYPOTHYROIDISM -TSH normal    HYPERTENSION -BP controlled -Was not on medications prior to admission -Beta blocker started this admission for rate control and for cardiac ischemia    Restless leg syndrome -Has been a long-standing issue and difficult to control -Recently meds have been adjusted to AM Requip in p.m. Mirapex -Possibly worsened by B12 deficiency therefore check an anemia panel    Dehydration with hypernatremia -Sodium has normalized and patient appears to be euvolemic at this point so we'll KVO IV fluids -Calculated water deficit at admission was 2.9 L    CAROTID ARTERY STENOSIS, WITHOUT INFARCTION -Last carotid duplex obtained in 2010 demonstrated no significant bilateral ICA stenosis    Macrocytosis/ Anemia -Baseline hemoglobin in 2010.3 with mild macrocytosis and B12 at that time was 533 -Repeat an anemia panel   DVT prophylaxis: Heparin infusion Code Status: DO NOT RESUSCITATE Family Communication: Son at bedside Disposition Plan/Expected LOS: Step down-will allow out of bed to chair-PT/OT evaluation   Consultants: Cardiology  Procedures: 2-D echocardiogram pending  Abdominal aortic ultrasound pending  Antibiotics: None  HPI/Subjective: Patient alert and currently denying any chest discomfort or shortness of breath while in the bed. Does endorse he was having some restless leg symptoms.  Objective: Blood pressure 123/81, pulse 90, temperature 97.4 F (36.3 C), temperature source Oral, resp. rate 20, height 6' (1.829 m), weight 167 lb 15.9 oz (76.2 kg),  SpO2 98.00%.  Intake/Output Summary (Last 24 hours) at 08/26/13 1249 Last data filed at 08/26/13 1100  Gross per 24 hour  Intake 1511.36 ml  Output    331 ml  Net 1180.36 ml     Exam: General: No acute respiratory distress Lungs: Fine crackles noted upon auscultation bilaterally, 2 L Cardiovascular: Irregular rate and rhythm without gallop or rub normal S1 and S2 except for grade 2-3/6 systolic murmur right upper sternal border, no peripheral edema or JVD Abdomen: Nontender, nondistended, soft, bowel sounds positive, no rebound, no ascites, no appreciable mass Musculoskeletal: No significant cyanosis, clubbing of bilateral lower extremities Neurological: Alert and oriented x 3, moves all extremities x 4 without focal neurological deficits, CN 2-12 intact-moving legs in bed and endorsing having symptoms consistent with restless leg  Scheduled Meds:  Scheduled Meds: . aspirin  81 mg Oral Daily  . metoprolol tartrate  12.5 mg Oral BID  . rOPINIRole  1 mg Oral Daily  . simvastatin  20 mg Oral q1800  . sodium chloride  3 mL Intravenous Q12H   Continuous Infusions: . sodium chloride 50 mL/hr at 08/26/13 1100  . heparin 950 Units/hr (08/25/13 1824)    Data Reviewed: Basic Metabolic Panel:  Recent Labs Lab 08/25/13 0948 08/26/13 0250  NA 149* 147  K 4.3 4.5  CL 113* 112  CO2 22 24  GLUCOSE 125* 119*  BUN 28* 28*  CREATININE 1.17 1.08  CALCIUM 9.1 8.7   Liver Function Tests:  Recent Labs Lab 08/26/13 0250  AST 53*  ALT 18  ALKPHOS 76  BILITOT 1.2  PROT 6.4  ALBUMIN 3.1*   No results found for this basename: LIPASE, AMYLASE,  in the last 168 hours No results found for this basename: AMMONIA,  in the last 168 hours CBC:  Recent Labs Lab 08/25/13 0948 08/26/13 0250  WBC 9.0 9.0  NEUTROABS 6.1  --   HGB 13.0 12.4*  HCT 39.5 38.3*  MCV 105.3* 104.9*  PLT 165 158   Cardiac Enzymes:  Recent Labs Lab 08/25/13 1555 08/25/13 2200 08/26/13 0250  TROPONINI  2.81* 7.27* 7.89*   BNP (last 3 results) No results found for this basename: PROBNP,  in the last 8760 hours CBG: No results found for this basename: GLUCAP,  in the last 168 hours  Recent Results (from the past 240 hour(s))  MRSA PCR SCREENING     Status: None   Collection Time    08/25/13  5:16 PM      Result Value Ref Range Status   MRSA by PCR NEGATIVE  NEGATIVE Final   Comment:            The GeneXpert MRSA Assay (FDA     approved for NASAL specimens     only), is one component of a     comprehensive MRSA colonization     surveillance program. It is not     intended to diagnose MRSA     infection nor to guide or     monitor treatment for     MRSA infections.     Studies:  Recent x-ray studies have been reviewed in detail by the Attending Physician  Time spent :     Junious Silk, ANP [facility] Office  [phone] Pager [phone]  **If unable to reach the above provider after paging please contact the Flow Manager @ [phone]  On-Call/Text Page:      [email or web address]      password [email or web address]  If 7PM-7AM, please  contact night-coverage [email or web address] Password [facility] 08/26/2013, 12:49 PM   LOS: 1 day   I have examined the patient, reviewed the chart and modified the above note which I agree with.   Malani Lees,MD Pager # on [email or web address] 08/26/2013, 3:19 PM

## 2013-08-26 NOTE — Progress Notes (Signed)
ANTICOAGULATION CONSULT NOTE - Follow Up Consult  Pharmacy Consult for heparin Indication: atrial fibrillation  Labs:  Recent Labs  08/25/13 0948 08/25/13 1555 08/25/13 2200 08/26/13 0250  HGB 13.0  --   --  12.4*  HCT 39.5  --   --  38.3*  PLT 165  --   --  158  APTT 41*  --   --   --   LABPROT 14.6  --   --   --   INR 1.16  --   --   --   HEPARINUNFRC  --   --   --  0.43  CREATININE 1.17  --   --  1.08  TROPONINI  --  2.81* 7.27* 7.89*    Assessment/Plan:  80yo male therapeutic on heparin with initial dosing for Afib.  Will continue gtt at current rate and confirm stable with additional level.  Vernard GamblesVeronda Tremain Rucinski, PharmD, BCPS  08/26/2013,4:26 AM

## 2013-08-26 NOTE — Evaluation (Signed)
Physical Therapy Evaluation Patient Details Name: Vernon English MRN: [id number] DOB: Mar 15, 1916 Today's Date: 08/26/2013 Time: [id number] PT Time Calculation (min): 25 min  PT Assessment / Plan / Recommendation History of Present Illness  Pt adm because not feeling well and fatigued.  In the ER patient was found to be in atrial fibrillation with rapid ventricular rate.    Clinical Impression  Pt admitted with afib and RVR.  Pt currently limited functionally due to the problems listed below.  (see problems list.)  Pt will benefit from PT to maximize function and safety to be able to get home safely with available assist of son.     PT Assessment  Patient needs continued PT services    Follow Up Recommendations  Home health PT;Supervision for mobility/OOB    Does the patient have the potential to tolerate intense rehabilitation      Barriers to Discharge        Equipment Recommendations  None recommended by PT    Recommendations for Other Services     Frequency Min 2X/week    Precautions / Restrictions Precautions Precautions: Fall Restrictions Weight Bearing Restrictions: No   Pertinent Vitals/Pain Unable to attain sats during ambulation due to cold hands, but immediately after hooking up 2L O2 pt read 90%.  Dyspnea moderate during gait.      Mobility  Bed Mobility Overal bed mobility: Needs Assistance Bed Mobility: Supine to Sit Supine to sit: Supervision General bed mobility comments: used momentume to bound up to EOB Transfers Overall transfer level: Needs assistance Equipment used: 1 person hand held assist Transfers: Sit to/from Stand Sit to Stand: Min assist General transfer comment: cues for safety; stability assist Ambulation/Gait Ambulation/Gait assistance: Min guard Ambulation Distance (Feet): 140 Feet Assistive device: 1 person hand held assist Gait Pattern/deviations: Step-through pattern;Decreased step length - right;Decreased step length -  left;Staggering left;Staggering right;Wide base of support Gait velocity: slowed General Gait Details: mildly staggery steps with wide bos. Forward flexed.    Exercises     PT Diagnosis: Generalized weakness  PT Problem List: Decreased strength;Decreased activity tolerance;Decreased balance;Decreased knowledge of use of DME;Decreased safety awareness PT Treatment Interventions: DME instruction;Gait training;Stair training;Functional mobility training;Therapeutic activities;Balance training;Patient/family education     PT Goals(Current goals can be found in the care plan section) Acute Rehab PT Goals Patient Stated Goal: Get home PT Goal Formulation: With patient Time For Goal Achievement: 08/26/13 Potential to Achieve Goals: Good  Visit Information  Last PT Received On: 08/26/13 Assistance Needed: +1 History of Present Illness: Pt adm because not feeling well and fatigued.  In the ER patient was found to be in atrial fibrillation with rapid ventricular rate.         Prior Functioning  Home Living Family/patient expects to be discharged to:: Private residence Living Arrangements: Children (lives with son) Available Help at Discharge: Family (up to 24 hours/day) Type of Home: House Home Access: Stairs to enter Entergy CorporationEntrance Stairs-Number of Steps: 3 Entrance Stairs-Rails: Left;Right Home Layout: One level Home Equipment: Environmental consultantWalker - 2 wheels;Cane - quad Prior Function Level of Independence: Independent with assistive device(s) (AND AT TIMES NEEDS assist) Communication Communication: No difficulties    Cognition  Cognition Arousal/Alertness: Awake/alert Behavior During Therapy: WFL for tasks assessed/performed Overall Cognitive Status:  (some confusion)    Extremity/Trunk Assessment Upper Extremity Assessment Upper Extremity Assessment: Overall WFL for tasks assessed Lower Extremity Assessment Lower Extremity Assessment: Generalized weakness;Overall [facility] for tasks assessed    Balance Balance Overall balance assessment: Needs assistance  Sitting-balance support: Feet supported;No upper extremity supported Sitting balance-Leahy Scale: Good Standing balance support: Single extremity supported;During functional activity Standing balance-Leahy Scale: Fair Standing balance comment: tendency to list posteriorly  End of Session PT - End of Session Equipment Utilized During Treatment: Oxygen Activity Tolerance: Patient tolerated treatment well Patient left: in chair;with call bell/phone within reach;with family/visitor present Nurse Communication: Mobility status  GP     Vernon English, Vernon English 08/26/2013, 4:49 PM 08/26/2013  Vernon English, PT [phone]  (pager)

## 2013-08-26 NOTE — Clinical Documentation Improvement (Signed)
Based on your clinical judgment, please provide greater specificity/diagnosis for elevated troponins Acute MI NSTEMI Type 2 Unstable Angina/ACS Other Condition Supporting Information: Risk Factors: CAD, AFib w RVR;  Signs & Symptoms:fatigue; per progress note 3/15:chest pain with shortness of breath" Diagnostics: 3/15 & 3/16 troponins: 2.81; 7.27; 7.89 Treatment: Medical treatment  Thank You, Amada Kingfisherebra J Hayes ,RN Clinical Documentation Specialist:  [phone] [facility]

## 2013-08-26 NOTE — Progress Notes (Signed)
OT Cancellation Note  Patient Details Name: Vernon English MRN: [id number] DOB: 1915/09/02   Cancelled Treatment:    Reason Eval/Treat Not Completed: Patient at procedure or test/ unavailable  Boykin ReaperConarpe, Mayer Vondrak M [phone] 08/26/2013, 2:53 PM

## 2013-08-26 NOTE — Consult Note (Addendum)
CONSULT NOTE  Date: 08/26/2013               Patient Name:  Vernon English MRN: [id number]  DOB: 11/24/1915 Age / Sex: 80 y.o., male        PCP: Lenoard AdenKaram, Jessica Marie Primary Cardiologist: New/ Demya Scruggs, former [facility] patient             Referring Physician: Butler Denmarkizwan              Reason for Consult: Afib with RVR           History of Present Illness: Patient is a 80 y.o. male with a PMHx of HTN, hypothyroidism, AAA, COPD, CAD , CABG, who was [facility] 08/25/2013 for evaluation of shortness of breath, constipation, decreased appetite, lack of energy.   He had some dyspnea for the past 10 days.   He has had some angina like CP.     His Troponin levels are elevated.   TSH is ok.  The patient was somewhat demented. He felt that we were in a car shop and wants to know how long I had been interested in antique  cars.  He did not appear to be in any distress. He has been having shortness of breath for about a week or so but his history is questionable.   Medications: Outpatient medications: Prescriptions prior to admission  Medication Sig Dispense Refill  . Multiple Vitamins-Minerals (CENTRUM SILVER PO) Take 1 tablet by mouth daily.      . pramipexole (MIRAPEX) 0.125 MG tablet Take one tablet by mouth at bedtime for leg pains  30 tablet  3  . rOPINIRole (REQUIP) 1 MG tablet Take 1 mg by mouth daily.        Current medications: Current Facility-Administered Medications  Medication Dose Route Frequency Provider Last Rate Last Dose  . 0.9 %  sodium chloride infusion   Intravenous Continuous Russella DarAllison L Ellis, NP 20 mL/hr at 08/26/13 1251    . acetaminophen (TYLENOL) tablet 650 mg  650 mg Oral Q6H PRN Army ChacoLaura K Easterwood, NP   650 mg at 08/26/13 0320  . aspirin chewable tablet 81 mg  81 mg Oral Daily Calvert CantorSaima Rizwan, MD   81 mg at 08/26/13 1252  . heparin ADULT infusion 100 units/mL (25000 units/250 mL)  950 Units/hr Intravenous Continuous Severiano GilbertFrank Rhea Wilson, RPH 9.5  mL/hr at 08/25/13 1824 950 Units/hr at 08/25/13 1824  . metoprolol tartrate (LOPRESSOR) tablet 12.5 mg  12.5 mg Oral BID Russella DarAllison L Ellis, NP   12.5 mg at 08/26/13 1252  . ondansetron (ZOFRAN) tablet 4 mg  4 mg Oral Q6H PRN Joseph ArtJessica U Vann, DO       Or  . ondansetron (ZOFRAN) injection 4 mg  4 mg Intravenous Q6H PRN Joseph ArtJessica U Vann, DO      . pramipexole (MIRAPEX) tablet 0.125 mg  0.125 mg Oral QHS PRN Joseph ArtJessica U Vann, DO      . rOPINIRole (REQUIP) tablet 1 mg  1 mg Oral Daily Joseph ArtJessica U Vann, DO   1 mg at 08/26/13 1009  . simvastatin (ZOCOR) tablet 20 mg  20 mg Oral q1800 Saima Rizwan, MD      . sodium chloride 0.9 % injection 3 mL  3 mL Intravenous Q12H Joseph ArtJessica U Vann, DO         No Known Allergies   Past Medical History  Diagnosis Date  . Hypertension   . Hypothyroidism   .  Abdominal aortic aneurysm 2006    3.9x3.6 cm per CT scan 2006  . Carotid artery stenosis   . COPD (chronic obstructive pulmonary disease)     per CT scan severe fibrosis, pt is asymptomatic  . Spinal stenosis     L3-L4  . Myocardial infarction 1996    s/p CABG  . Senile dementia, uncomplicated   . Hypertrophy of prostate without urinary obstruction and other lower urinary tract symptoms (LUTS)   . Restless legs syndrome (RLS)   . Abdominal aneurysm without mention of rupture   . Other dyspnea and respiratory abnormality   . Slowing of urinary stream     Past Surgical History  Procedure Laterality Date  . Lumbar laminectomy      L4-L5  . Coronary artery bypass graft  1998    Family History  Problem Relation Age of Onset  . Heart disease Son   . Heart disease Father   . Heart disease Brother     Social History:  reports that he quit smoking about 59 years ago. His smoking use included Cigarettes. He smoked 0.00 packs per day. He does not have any smokeless tobacco history on file. He reports that he drinks alcohol. He reports that he does not use illicit drugs.   Review of Systems: Constitutional:   denies fever, chills, diaphoresis, appetite change and fatigue.  HEENT: denies photophobia, eye pain, redness, hearing loss, ear pain, congestion, sore throat, rhinorrhea, sneezing, neck pain, neck stiffness and tinnitus.  Respiratory: admits to SOB, DOE, cough, chest tightness, and wheezing.  Cardiovascular: admits to chest pain  Gastrointestinal: denies nausea, vomiting, abdominal pain, diarrhea, constipation, blood in stool.  Genitourinary: denies dysuria, urgency, frequency, hematuria, flank pain and difficulty urinating.  Musculoskeletal: denies  myalgias, back pain, joint swelling, arthralgias and gait problem.   Skin: denies pallor, rash and wound.  Neurological: denies dizziness, seizures, syncope, weakness, light-headedness, numbness and headaches.   Hematological: denies adenopathy, easy bruising, personal or family bleeding history.  Psychiatric/ Behavioral: denies suicidal ideation, mood changes, confusion, nervousness, sleep disturbance and agitation.    Physical Exam: BP 123/81  Pulse 90  Temp(Src) 97.4 F (36.3 C) (Oral)  Resp 20  Ht 6' (1.829 m)  Wt 167 lb 15.9 oz (76.2 kg)  BMI 22.78 kg/m2  SpO2 98%  Wt Readings from Last 3 Encounters:  08/26/13 167 lb 15.9 oz (76.2 kg)  01/17/13 167 lb 12.8 oz (76.114 kg)  10/15/12 167 lb 12.8 oz (76.114 kg)    General: Vital signs reviewed and noted. Well-developed, well-nourished, in no acute distress; alert,   Head: Normocephalic, atraumatic, sclera anicteric,   Neck: Supple. Negative for carotid bruits. No JVD   Lungs:   he has coarse rhonchi and wheezes especially in his right base. Left side is fairly clear   Heart:  his heart rate is irregular irregular. He has a 2-3/6 systolic ejection murmur radiating up to the upper right sternal border and into his left carotid.  His PMI is laterally displaced.   Abdomen:  Soft, non-tender, non-distended with normoactive bowel sounds. No hepatomegaly. No rebound/guarding. No obvious  abdominal masses   MSK: Strength and the appear normal for age.   Extremities: No clubbing or cyanosis. No edema.  Distal pedal pulses are 2+ and equal   Neurologic: Alert and oriented X 3. Moves all extremities spontaneously.  Psych: Responds to questions appropriately with a normal affect.     Lab results: Basic Metabolic Panel:  Recent Labs Lab 08/25/13 [phone]  08/26/13 0250  NA 149* 147  K 4.3 4.5  CL 113* 112  CO2 22 24  GLUCOSE 125* 119*  BUN 28* 28*  CREATININE 1.17 1.08  CALCIUM 9.1 8.7    Liver Function Tests:  Recent Labs Lab 08/26/13 0250  AST 53*  ALT 18  ALKPHOS 76  BILITOT 1.2  PROT 6.4  ALBUMIN 3.1*   No results found for this basename: LIPASE, AMYLASE,  in the last 168 hours No results found for this basename: AMMONIA,  in the last 168 hours  CBC:  Recent Labs Lab 08/25/13 0948 08/26/13 0250  WBC 9.0 9.0  NEUTROABS 6.1  --   HGB 13.0 12.4*  HCT 39.5 38.3*  MCV 105.3* 104.9*  PLT 165 158    Cardiac Enzymes:  Recent Labs Lab 08/25/13 1555 08/25/13 2200 08/26/13 0250  TROPONINI 2.81* 7.27* 7.89*    BNP: No components found with this basename: POCBNP,   CBG: No results found for this basename: GLUCAP,  in the last 168 hours  Coagulation Studies:  Recent Labs  08/25/13 0948  LABPROT 14.6  INR 1.16     Other results:  EKG:  Atrial fibrillation with a ventricular response of 87. His nonspecific ST and T wave changes.   Imaging: Dg Chest Port 1 View  08/25/2013   CLINICAL DATA:  Left-sided chest pain and chronic shortness of breath with exertion. Remote CABG.  EXAM: PORTABLE CHEST - 1 VIEW  COMPARISON:  DG CHEST 1V PORT dated 08/08/2008; DG CHEST 2 VIEW dated 12/13/2004; CT CHEST W/CM dated 02/17/2005  FINDINGS: Evidence of CABG noted with moderate enlargement of cardiomediastinal silhouette. There are diffusely prominent interstitial reticular markings with suggestion of Kerley B lines and trace pleural effusions. No focal  parenchymal consolidation.  IMPRESSION: New probable interstitial edema or atypical infection/inflammatory process. If symptoms persist, consider PA and lateral chest radiographs obtained at full inspiration when the patient is clinically able.   Electronically Signed   By: Christiana Pellant M.D.   On: 08/25/2013 11:55    Chest chest x-ray is a very poor quality film. He has bilateral infiltrates. The patient is not taking a deep breath and it appears to be taken at a angle.  Assessment & Plan:  1. Atrial fibrillation with a rapid ventricular response: The patient was admitted with atrial fibrillation with a rapid ventricular sounds. His heart rate is slower at present. He has tight wheezing and rhonchi on his right base. In addition, it is associated with an elevated troponin level.    At present, and his atrial fibrillation is well controlled and he is not in any distress. He denies any chest pain. He does have some wheezing and I suspect that he has had had either a CPD exacerbation or perhaps has pneumonia- although his white blood cell count is normal and he doesn't have a fever.  Given the location of the wheezes and rhonchi, I would worry about chronic aspiration.  Will give him nebulizers to help with his wheezing. I would continue with rate control of his atrial fibrillation.  he's currently on heparin. Will need to make sure that he is a suitable anticoagulation candidate before we start him on an oral anticoagulant.  We will continue with the current dose of metoprolol and gradually titrate up.  Echo is ordered.   2. Coronary artery disease: He presents without any symptoms of ischemic heart disease but he appears to be somewhat pleasantly demented. He thought it we were in an  automobile shop. I'm not sure that he would remember whether or not he had chest discomfort.  Given his age of 44 years, I would favor conservative management with medical therapy. Will get an echocardiogram for  further assessment of his left ventricular function.  3. Bronchitis/COPD exacerbation: We'll give him Xopenex inhalers every 6 hours.  4. Hyperlipidemia: Continue with the simvastatin.  Vesta Mixer, Montez Hageman., MD, [facility] 08/26/2013, 1:28 PM Office - [phone] Pager [phone]

## 2013-08-27 DIAGNOSIS — I714 Abdominal aortic aneurysm, without rupture, unspecified: Secondary | ICD-10-CM

## 2013-08-27 DIAGNOSIS — I5021 Acute systolic (congestive) heart failure: Secondary | ICD-10-CM | POA: Diagnosis present

## 2013-08-27 DIAGNOSIS — I350 Nonrheumatic aortic (valve) stenosis: Secondary | ICD-10-CM | POA: Diagnosis present

## 2013-08-27 DIAGNOSIS — I359 Nonrheumatic aortic valve disorder, unspecified: Secondary | ICD-10-CM

## 2013-08-27 DIAGNOSIS — I509 Heart failure, unspecified: Secondary | ICD-10-CM

## 2013-08-27 LAB — FERRITIN: FERRITIN: 145 ng/mL (ref 22–322)

## 2013-08-27 LAB — BASIC METABOLIC PANEL
BUN: 28 mg/dL — AB (ref 6–23)
CHLORIDE: 108 meq/L (ref 96–112)
CO2: 23 meq/L (ref 19–32)
CREATININE: 1.08 mg/dL (ref 0.50–1.35)
Calcium: 8.8 mg/dL (ref 8.4–10.5)
GFR calc Af Amer: 65 mL/min — ABNORMAL LOW (ref >90)
GFR calc non Af Amer: 56 mL/min — ABNORMAL LOW (ref >90)
GLUCOSE: 134 mg/dL — AB (ref 70–99)
Potassium: 4.6 mEq/L (ref 3.7–5.3)
Sodium: 145 mEq/L (ref 137–147)

## 2013-08-27 LAB — CBC
HEMATOCRIT: 37 % — AB (ref 39.0–52.0)
HEMOGLOBIN: 12 g/dL — AB (ref 13.0–17.0)
MCH: 33.9 pg (ref 26.0–34.0)
MCHC: 32.4 g/dL (ref 30.0–36.0)
MCV: 104.5 fL — ABNORMAL HIGH (ref 78.0–100.0)
Platelets: 156 10*3/uL (ref 150–400)
RBC: 3.54 MIL/uL — ABNORMAL LOW (ref 4.22–5.81)
RDW: 14.7 % (ref 11.5–15.5)
WBC: 8.8 10*3/uL (ref 4.0–10.5)

## 2013-08-27 LAB — RETICULOCYTES
RBC.: 3.54 MIL/uL — ABNORMAL LOW (ref 4.22–5.81)
Retic Count, Absolute: 49.6 10*3/uL (ref 19.0–186.0)
Retic Ct Pct: 1.4 % (ref 0.4–3.1)

## 2013-08-27 LAB — IRON AND TIBC
Iron: 52 ug/dL (ref 42–135)
SATURATION RATIOS: 23 % (ref 20–55)
TIBC: 229 ug/dL (ref 215–435)
UIBC: 177 ug/dL (ref 125–400)

## 2013-08-27 LAB — HEPARIN LEVEL (UNFRACTIONATED)
Heparin Unfractionated: 0.11 IU/mL — ABNORMAL LOW (ref 0.30–0.70)
Heparin Unfractionated: 0.27 IU/mL — ABNORMAL LOW (ref 0.30–0.70)

## 2013-08-27 LAB — FOLATE: Folate: 18.6 ng/mL

## 2013-08-27 LAB — TROPONIN I: Troponin I: 2.11 ng/mL (ref <0.30)

## 2013-08-27 LAB — VITAMIN B12: Vitamin B-12: 538 pg/mL (ref 211–911)

## 2013-08-27 MED ORDER — DIGOXIN 0.0625 MG HALF TABLET
0.0625 mg | ORAL_TABLET | Freq: Every day | ORAL | Status: DC
  Administered 2013-08-28 – 2013-08-30 (×3): 0.0625 mg via ORAL
  Filled 2013-08-27 (×4): qty 1

## 2013-08-27 MED ORDER — LEVALBUTEROL HCL 1.25 MG/0.5ML IN NEBU
1.2500 mg | INHALATION_SOLUTION | Freq: Three times a day (TID) | RESPIRATORY_TRACT | Status: DC
  Administered 2013-08-27: 1.25 mg via RESPIRATORY_TRACT
  Filled 2013-08-27 (×4): qty 0.5

## 2013-08-27 MED ORDER — DIGOXIN 0.25 MG/ML IJ SOLN
0.2500 mg | Freq: Two times a day (BID) | INTRAMUSCULAR | Status: AC
  Administered 2013-08-27 (×2): 0.25 mg via INTRAVENOUS
  Filled 2013-08-27 (×2): qty 1

## 2013-08-27 MED ORDER — POTASSIUM CHLORIDE CRYS ER 20 MEQ PO TBCR
40.0000 meq | EXTENDED_RELEASE_TABLET | Freq: Every day | ORAL | Status: DC
  Administered 2013-08-27 – 2013-08-28 (×2): 40 meq via ORAL
  Filled 2013-08-27 (×2): qty 2

## 2013-08-27 MED ORDER — METOPROLOL TARTRATE 25 MG PO TABS
25.0000 mg | ORAL_TABLET | Freq: Two times a day (BID) | ORAL | Status: DC
  Administered 2013-08-27 – 2013-08-28 (×3): 25 mg via ORAL
  Filled 2013-08-27 (×3): qty 1

## 2013-08-27 MED ORDER — FUROSEMIDE 10 MG/ML IJ SOLN
40.0000 mg | Freq: Two times a day (BID) | INTRAMUSCULAR | Status: DC
  Administered 2013-08-27 – 2013-08-30 (×6): 40 mg via INTRAVENOUS
  Filled 2013-08-27 (×12): qty 4

## 2013-08-27 MED ORDER — HEPARIN (PORCINE) IN NACL 100-0.45 UNIT/ML-% IJ SOLN
1450.0000 [IU]/h | INTRAMUSCULAR | Status: DC
  Administered 2013-08-27: 1300 [IU]/h via INTRAVENOUS
  Filled 2013-08-27: qty 250

## 2013-08-27 MED ORDER — IPRATROPIUM BROMIDE 0.02 % IN SOLN
0.5000 mg | Freq: Three times a day (TID) | RESPIRATORY_TRACT | Status: DC
  Administered 2013-08-27 – 2013-08-29 (×7): 0.5 mg via RESPIRATORY_TRACT
  Filled 2013-08-27 (×7): qty 2.5

## 2013-08-27 MED ORDER — PRAMIPEXOLE DIHYDROCHLORIDE 0.125 MG PO TABS
0.1250 mg | ORAL_TABLET | Freq: Every day | ORAL | Status: DC
Start: 2013-08-27 — End: 2013-08-30
  Administered 2013-08-27 – 2013-08-29 (×3): 0.125 mg via ORAL
  Filled 2013-08-27 (×5): qty 1

## 2013-08-27 MED ORDER — LEVALBUTEROL HCL 1.25 MG/0.5ML IN NEBU
1.2500 mg | INHALATION_SOLUTION | Freq: Three times a day (TID) | RESPIRATORY_TRACT | Status: DC | PRN
  Filled 2013-08-27: qty 0.5

## 2013-08-27 MED ORDER — METOPROLOL TARTRATE 1 MG/ML IV SOLN
5.0000 mg | Freq: Four times a day (QID) | INTRAVENOUS | Status: DC
  Administered 2013-08-27 – 2013-08-28 (×4): 5 mg via INTRAVENOUS
  Filled 2013-08-27 (×8): qty 5

## 2013-08-27 NOTE — Progress Notes (Signed)
[facility]  JOB HOLTSCLAW WUJ:[id number] DOB: 19-Sep-1915 DOA: 08/25/2013 PCP: Lenoard Aden, NP  Brief narrative: 80 year old male patient who endorsed feeling poorly for 2-3 days prior to presentation. Patient also endorsed multiple complaints of constipation, decreased appetite and decreased energy. At 7 PM the night prior to presentation patient developed chest pain or shortness of breath which eventually resolved.  After presenting to the ER he was found to be in atrial fibrillation with rapid ventricular response. His primary cardiologist is Dr. Clarene Duke with [facility] heart [facility] vascular. In addition to have an tachycardia patient was hypotensive at presentation. This improved with the addition of IV fluids and a Cardizem drip.  Assessment/Plan: Active Problems:   Atrial fibrillation with RVR -rate better controlled 3/16 but today up to 140s at times-pt aware of palpitations -will increase Lopressor PO and add prn IV Lopressor-would like to avoid CCB in setting of low EF and acute MI -Continue IV heparin for now-doubt appropriate candidate for LT anticoagulation more than ASA and/or Plavix -Consulted cardiology who have recommended palliative care -rate control imperative for HF control    Respiratory failure with hypoxia 2/2 edema and acute systolic heart failure -Chest x-ray done at admission question interstitial edema -new finding of systolic dysfunction this admission -Did seem dehydrated at presentation so was given IVFs but given abnormal chest x-ray in setting of atrial fibrillation and possible recent tachycardia induced cardiomyopathy IV fluids were decreased to keep open-today has orthopnea so will give Lasix also increase O2 demands today -Continue supportive care and wean oxygen as tolerated -No fever or cough so doubt infectious etiology    Elevated troponin/ Chest pain -Troponin thus far has peaked at greater than 7.0  with current downward trend -Discussed with patient and son and no desire to pursue invasive workup -Begin medical therapy: Beta blocker, aspirin and statin -ECHO reveals severe LV dysfunction (no mention of RWMA) with only mild LVH and mild LV dilatation so seems more c/w ischemic etiology   Cardiac murmur/severe Ao stenosis -As above patient with known aortic stenosis and mitral stenosis -Echo repeated this admission showed severe Ao stenosis with valve area 0.66    AAA/thoracic aneurysm -Last imaging in 2006 and at that time infrarenal abdominal aneurysm measured 3.9 x 3.6 cm -abdominal ultrasound for imaging this admission infra renal AAA has increased to 6.3 -son said pt also has thoracic aneurysm- d/w him no need to image since not a surgical candidate- suspect this likely has also increased in size  -d/w son now very high risk for spotatenous rupture    HYPOTHYROIDISM -TSH normal    HYPERTENSION -BP controlled -Was not on medications prior to admission -Beta blocker started this admission for rate control and for cardiac ischemia    Restless leg syndrome -Has been a long-standing issue and difficult to control -Recently meds have been adjusted to AM Requip in p.m. Mirapex -Possibly worsened by B12 deficiency therefore check an anemia panel    Dehydration with hypernatremia -Sodium has normalized and patient appears to be euvolemic at this point so we'll KVO IV fluids -Calculated water deficit at admission was 2.9 L    CAROTID ARTERY STENOSIS, WITHOUT INFARCTION -Last carotid duplex obtained in 2010 demonstrated no significant bilateral ICA stenosis    Macrocytosis/ Anemia -Baseline hemoglobin in 2010.3 with mild macrocytosis and B12 at that time was 533 -Repeat an anemia panel   Dementia -son endorses this as informal dx and has noticed  progressive changes x 2 years -pt has not driven in 2 years -son amenable to pursue Hospice if pt has untreatable issues   DVT  prophylaxis: Heparin infusion Code Status: DO NOT RESUSCITATE Family Communication: Son at bedside Disposition Plan/Expected LOS: Step down-will allow out of bed to chair (once HR controlled) and PT/OT - called palliative care as recommended by Cardiology   Consultants: Cardiology  Procedures: 2-D echocardiogram  - Left ventricle: The cavity size was mildly dilated. Wall thickness was increased in a pattern of mild LVH. Systolic function was moderately to severely reduced. The estimated ejection fraction was in the range of 30% to 35%. - Aortic valve: There was severe stenosis. Mild regurgitation. Valve area: 0.66cm^2(VTI). Valve area: 0.64cm^2 (Vmax). - Mitral valve: Moderately to severely calcified annulus. Moderately calcified leaflets . The findings are consistent with trivial stenosis. Mild regurgitation. Valve area by continuity equation (using LVOT flow): 1.58cm^2. - Left atrium: The atrium was mildly to moderately dilated. - Right ventricle: The cavity size was mildly dilated. Systolic function was mildly reduced. - Right atrium: The atrium was mildly dilated.   Abdominal aortic ultrasound pending Fusiform dilatation of the infrarenal abdominal aorta shows significant enlargement since 2006. Maximum AP diameter is 6.3 cm and transverse with is 6.2 cm. Diameter in 2006 was approximately 4.1 cm. Circumferential mural thrombus is identified in the aneurysm. No surrounding fluid collections are identified by ultrasound.   Antibiotics: None  HPI/Subjective: Patient alert and confused. Endorsed awareness of palpitations and orthopnea  Objective: Blood pressure 127/92, pulse 58, temperature 97.5 F (36.4 C), temperature source Oral, resp. rate 20, height 6' (1.829 m), weight 167 lb 15.9 oz (76.2 kg), SpO2 100.00%.  Intake/Output Summary (Last 24 hours) at 08/27/13 1112 Last data filed at 08/27/13 0800  Gross per 24 hour  Intake 370.96 ml  Output    240 ml  Net  130.96 ml     Exam: General: No acute respiratory distress Lungs: Fine crackles noted upon auscultation bilaterally but more dense left mid field, 4 L Cardiovascular: Irregular rate and rhythm without gallop or rub normal S1 and S2 except for grade 2-3/6 systolic murmur right upper sternal border, no peripheral edema -5 cm JVD Abdomen: Nontender, nondistended, soft, bowel sounds positive, no rebound, no ascites, no appreciable mass Musculoskeletal: No significant cyanosis, clubbing of bilateral lower extremities Neurological: Alert and oriented x name, moves all extremities x 4 without focal neurological deficits, CN 2-12 intact-moving legs in bed and endorsing having symptoms consistent with restless leg  Scheduled Meds:  Scheduled Meds: . aspirin  81 mg Oral Daily  . furosemide  40 mg Intravenous BID  . ipratropium  0.5 mg Nebulization TID  . metoprolol  5 mg Intravenous 4 times per day  . metoprolol tartrate  25 mg Oral BID  . potassium chloride  40 mEq Oral Daily  . pramipexole  0.125 mg Oral QHS  . rOPINIRole  1 mg Oral Daily  . simvastatin  20 mg Oral q1800  . sodium chloride  3 mL Intravenous Q12H   Continuous Infusions: . sodium chloride 10 mL/hr at 08/27/13 1100  . heparin 1,200 Units/hr (08/27/13 1100)    Data Reviewed: Basic Metabolic Panel:  Recent Labs Lab 08/25/13 0948 08/26/13 0250 08/27/13 0438  NA 149* 147 145  K 4.3 4.5 4.6  CL 113* 112 108  CO2 22 24 23   GLUCOSE 125* 119* 134*  BUN 28* 28* 28*  CREATININE 1.17 1.08 1.08  CALCIUM 9.1 8.7 8.8  Liver Function Tests:  Recent Labs Lab 08/26/13 0250  AST 53*  ALT 18  ALKPHOS 76  BILITOT 1.2  PROT 6.4  ALBUMIN 3.1*   No results found for this basename: LIPASE, AMYLASE,  in the last 168 hours No results found for this basename: AMMONIA,  in the last 168 hours CBC:  Recent Labs Lab 08/25/13 0948 08/26/13 0250 08/27/13 0438  WBC 9.0 9.0 8.8  NEUTROABS 6.1  --   --   HGB 13.0 12.4* 12.0*    HCT 39.5 38.3* 37.0*  MCV 105.3* 104.9* 104.5*  PLT 165 158 156   Cardiac Enzymes:  Recent Labs Lab 08/25/13 1555 08/25/13 2200 08/26/13 0250 08/27/13 0438  TROPONINI 2.81* 7.27* 7.89* 2.11*   BNP (last 3 results) No results found for this basename: PROBNP,  in the last 8760 hours CBG: No results found for this basename: GLUCAP,  in the last 168 hours  Recent Results (from the past 240 hour(s))  MRSA PCR SCREENING     Status: None   Collection Time    08/25/13  5:16 PM      Result Value Ref Range Status   MRSA by PCR NEGATIVE  NEGATIVE Final   Comment:            The GeneXpert MRSA Assay (FDA     approved for NASAL specimens     only), is one component of a     comprehensive MRSA colonization     surveillance program. It is not     intended to diagnose MRSA     infection nor to guide or     monitor treatment for     MRSA infections.     Studies:  Recent x-ray studies have been reviewed in detail by the Attending Physician  Time spent :     Junious Silkllison Ellis, ANP [facility] Office  [phone] Pager [phone]  **If unable to reach the above provider after paging please contact the Flow Manager @ [phone]  On-Call/Text Page:      [email or web address]      password [email or web address]  If 7PM-7AM, please contact night-coverage [email or web address] Password [email or web address] 08/27/2013, 11:12 AM   LOS: 2 days

## 2013-08-27 NOTE — Progress Notes (Signed)
ANTICOAGULATION CONSULT NOTE - Follow Up Consult  Pharmacy Consult for heparin Indication: chest pain/ACS and atrial fibrillation  Labs:  Recent Labs  08/25/13 0948  08/25/13 2200  08/26/13 0250 08/26/13 1050 08/27/13 0438 08/27/13 1405  HGB 13.0  --   --   --  12.4*  --  12.0*  --   HCT 39.5  --   --   --  38.3*  --  37.0*  --   PLT 165  --   --   --  158  --  156  --   APTT 41*  --   --   --   --   --   --   --   LABPROT 14.6  --   --   --   --   --   --   --   INR 1.16  --   --   --   --   --   --   --   HEPARINUNFRC  --   --   --   < > 0.43 0.25* 0.11* 0.27*  CREATININE 1.17  --   --   --  1.08  --  1.08  --   TROPONINI  --   < > 7.27*  --  7.89*  --  2.11*  --   < > = values in this interval not displayed.  Assessment: [age]yo male with new onset afib on heparin at units/hr and heparin level is just below goal (HL= 0.27)  Goal of Therapy:  Heparin level 0.3-0.7 units/ml   Plan:  -Increase heparin to 1300 units/hr -Heparin level and CBC in am  Harland GermanAndrew Elchonon Maxson, Pharm D 08/27/2013 3:39 PM

## 2013-08-27 NOTE — Progress Notes (Signed)
ANTICOAGULATION CONSULT NOTE - Follow Up Consult  Pharmacy Consult for heparin Indication: chest pain/ACS and atrial fibrillation  Labs:  Recent Labs  08/25/13 0948 08/25/13 1555 08/25/13 2200 08/26/13 0250 08/26/13 1050 08/27/13 0438  HGB 13.0  --   --  12.4*  --  12.0*  HCT 39.5  --   --  38.3*  --  37.0*  PLT 165  --   --  158  --  156  APTT 41*  --   --   --   --   --   LABPROT 14.6  --   --   --   --   --   INR 1.16  --   --   --   --   --   HEPARINUNFRC  --   --   --  0.43 0.25* 0.11*  CREATININE 1.17  --   --  1.08  --   --   TROPONINI  --  2.81* 7.27* 7.89*  --   --     Assessment: 80yo male now with lower heparin level; per yesterday's notes gtt was supposed to have been increased but the order was never entered and gtt is still running at 950 units/hr.  Goal of Therapy:  Heparin level 0.3-0.7 units/ml   Plan:  Will increase heparin gtt by 3 units/kg/hr to 1200 units/hr and check level in 8hr.  Vernard GamblesVeronda Cadynce Garrette, PharmD, BCPS  08/27/2013,5:49 AM

## 2013-08-27 NOTE — Progress Notes (Signed)
PT Cancellation Note  Patient Details Name: Vernon English MRN: [id number] DOB: May 15, 1916   Cancelled Treatment:    Reason Eval/Treat Not Completed: Medical issues which prohibited therapy (pt back in Afib with RVR and current resting rate 140 discussed with MD . Will hold)   Toney Sangabor, Valeda Corzine Beth 08/27/2013, 9:11 AM Delaney MeigsMaija Tabor Anahit Klumb, PT [phone]

## 2013-08-27 NOTE — Progress Notes (Signed)
Attempted to see pt today.  Pt's resting heart rate in 120s.  Nurse asked to defer.  Will return as schedule allows. Tory Vernon English, [place] [phone]

## 2013-08-27 NOTE — Progress Notes (Addendum)
       Patient Name: Vernon English Date of Encounter: 08/27/2013    SUBJECTIVE: 80 year old gentleman with critical aortic stenosis, progressive physical decline over the last 6 months, immobility, frequent falls, and now new onset atrial fibrillation with rapid ventricular response and acute diastolic heart failure. He voices no complaints.  TELEMETRY:  Atrial fibrillation with moderate rate control on IV diltiazem Filed Vitals:   08/27/13 0945 08/27/13 1000 08/27/13 1100 08/27/13 1200  BP: 113/76 94/74 109/84 114/82  Pulse: 45 104 57 105  Temp:    97.7 F (36.5 C)  TempSrc:    Oral  Resp:      Height:      Weight:      SpO2: 96% 99% 100% 100%    Intake/Output Summary (Last 24 hours) at 08/27/13 1229 Last data filed at 08/27/13 0800  Gross per 24 hour  Intake 351.46 ml  Output    240 ml  Net 111.46 ml    LABS: Basic Metabolic Panel:  Recent Labs  16/03/9602/16/15 0250 08/27/13 0438  NA 147 145  K 4.5 4.6  CL 112 108  CO2 24 23  GLUCOSE 119* 134*  BUN 28* 28*  CREATININE 1.08 1.08  CALCIUM 8.7 8.8   CBC:  Recent Labs  08/25/13 0948 08/26/13 0250 08/27/13 0438  WBC 9.0 9.0 8.8  NEUTROABS 6.1  --   --   HGB 13.0 12.4* 12.0*  HCT 39.5 38.3* 37.0*  MCV 105.3* 104.9* 104.5*  PLT 165 158 156   Cardiac Enzymes:  Recent Labs  08/25/13 2200 08/26/13 0250 08/27/13 0438  TROPONINI 7.27* 7.89* 2.11*   BNP    Component Value Date/Time   PROBNP 707.0* 08/08/2008 1305    Radiology/Studies:  No new data  Physical Exam: Blood pressure 114/82, pulse 105, temperature 97.7 F (36.5 C), temperature source Oral, resp. rate 20, height 6' (1.829 m), weight 167 lb 15.9 oz (76.2 kg), SpO2 100.00%. Weight change:    High-pitched crescendo decrescendo murmur of aortic stenosis. No diastolic murmurs heard. Irregularly irregular rhythm. Relatively flat neck veins. No peripheral edema  ASSESSMENT:  1. Severe aortic stenosis 2. New-onset atrial fibrillation with  rapid ventricular response 3. Acute on chronic systolic heart failure, without clinical evidence of volume overload currently 4. Non-ST elevation myocardial infarction 5. Abdominal aortic aneurysm, inoperable  6. Elderly and frail  Plan:  1. The patient is near end of life. His multiple problems that cannot be addressed in a definitive fashion. 2. Rate control by adding digoxin. Would not impact blood pressure as diltiazem does 3. Discussion with son concerning hospice and palliative care.  Overall cardiovascular prognosis is extremely poor given age, comorbidities, and frailty. 4. The patient should probably be considered for transfer to telemetry. 5. Not a chronic Coumadin/anticoagulation candidate because of weakness and frequent falls  Prolonged and complex services considering end of life issues Selinda EonSigned, SMITH III,HENRY W 08/27/2013, 12:29 PM

## 2013-08-28 DIAGNOSIS — J9600 Acute respiratory failure, unspecified whether with hypoxia or hypercapnia: Secondary | ICD-10-CM

## 2013-08-28 DIAGNOSIS — R0989 Other specified symptoms and signs involving the circulatory and respiratory systems: Secondary | ICD-10-CM

## 2013-08-28 DIAGNOSIS — R0609 Other forms of dyspnea: Secondary | ICD-10-CM

## 2013-08-28 DIAGNOSIS — Z515 Encounter for palliative care: Secondary | ICD-10-CM

## 2013-08-28 DIAGNOSIS — R0600 Dyspnea, unspecified: Secondary | ICD-10-CM

## 2013-08-28 LAB — HEPARIN LEVEL (UNFRACTIONATED): Heparin Unfractionated: 0.25 IU/mL — ABNORMAL LOW (ref 0.30–0.70)

## 2013-08-28 LAB — BASIC METABOLIC PANEL
BUN: 31 mg/dL — AB (ref 6–23)
CALCIUM: 8.6 mg/dL (ref 8.4–10.5)
CO2: 24 meq/L (ref 19–32)
Chloride: 107 mEq/L (ref 96–112)
Creatinine, Ser: 1.25 mg/dL (ref 0.50–1.35)
GFR calc Af Amer: 54 mL/min — ABNORMAL LOW (ref >90)
GFR, EST NON AFRICAN AMERICAN: 47 mL/min — AB (ref >90)
GLUCOSE: 109 mg/dL — AB (ref 70–99)
POTASSIUM: 4.5 meq/L (ref 3.7–5.3)
Sodium: 144 mEq/L (ref 137–147)

## 2013-08-28 LAB — CBC
HEMATOCRIT: 37.1 % — AB (ref 39.0–52.0)
Hemoglobin: 12.1 g/dL — ABNORMAL LOW (ref 13.0–17.0)
MCH: 34.2 pg — ABNORMAL HIGH (ref 26.0–34.0)
MCHC: 32.6 g/dL (ref 30.0–36.0)
MCV: 104.8 fL — AB (ref 78.0–100.0)
Platelets: 156 10*3/uL (ref 150–400)
RBC: 3.54 MIL/uL — ABNORMAL LOW (ref 4.22–5.81)
RDW: 14.8 % (ref 11.5–15.5)
WBC: 10.2 10*3/uL (ref 4.0–10.5)

## 2013-08-28 MED ORDER — LORAZEPAM 2 MG/ML IJ SOLN
1.0000 mg | Freq: Four times a day (QID) | INTRAMUSCULAR | Status: DC | PRN
  Administered 2013-08-30: 1 mg via INTRAVENOUS
  Filled 2013-08-28: qty 1

## 2013-08-28 MED ORDER — MORPHINE SULFATE 2 MG/ML IJ SOLN
1.0000 mg | INTRAMUSCULAR | Status: DC | PRN
  Administered 2013-08-30: 1 mg via INTRAVENOUS
  Filled 2013-08-28: qty 1

## 2013-08-28 MED ORDER — METOPROLOL TARTRATE 25 MG PO TABS: 25.0000 mg | ORAL_TABLET | Freq: Once | ORAL | Status: DC

## 2013-08-28 MED ORDER — BISACODYL 10 MG RE SUPP: 10.0000 mg | Freq: Every day | RECTAL | Status: DC | PRN

## 2013-08-28 MED ORDER — METOPROLOL TARTRATE 50 MG PO TABS
50.0000 mg | ORAL_TABLET | Freq: Two times a day (BID) | ORAL | Status: DC
  Administered 2013-08-28 – 2013-08-30 (×3): 50 mg via ORAL
  Filled 2013-08-28 (×8): qty 1

## 2013-08-28 NOTE — Consult Note (Signed)
Patient ZO:[id number]:Vernon English      DOB: [birth year]      WUJ:[id number]     [facility]    Consult Requested by:  Dr Butler Denmarkizwan     PCP: Lenoard AdenKaram, Jessica Marie, NP Reason for Consultation:Calification of GOC and options     Phone Number:[phone]  Assessment of patients Current state:  80 year old gentleman with critical aortic stenosis, progressive physical decline over the last 6 months, immobility, frequent falls, and now new onset atrial fibrillation with rapid ventricular response and acute diastolic heart failure. He voices no complaints.  Family is faced with advanced directive decisions and anticipatory care needs    Consult is for review of medical treatment options, clarification of goals of care and end of life issues, disposition and options, and symptom recommendation.  This NP Lorinda CreedMary Quyen Cutsforth reviewed medical records, received report from team, assessed the patient and then meet at the patient's bedside along with Loree FeeJeffrey Crager # [phone]  to discuss diagnosis prognosis, GOC, EOL wishes disposition and options.   A detailed discussion was had today regarding advanced directives.  Concepts specific to code status, artifical feeding and hydration, continued IV antibiotics and rehospitalization was had.  The difference between a aggressive medical intervention path  and a palliative comfort care path for this patient at this time was had.  Values and goals of care important to patient and family were attempted to be elicited.  Concept of Hospice and Palliative Care were discussed  Natural trajectory and expectations at EOL were discussed.  Questions and concerns addressed.  Hard Choices booklet left for review. Family encouraged to call with questions or concerns.  PMT will continue to support holistically.       Goals of Care: 1.  Code Status:  DNR/DNI-comfort is main focus of care   2. Scope of Treatment: 1. Vital Signs:  daily  2. Respiratory/Oxygen:for comfort only 3. Nutritional Support/Tube Feeds:no artificial feeding now or in the future 4. Antibiotics: none 5. Blood Products:none 6. XBM:WUXLVF:none 7. Review of Medications to be discontinued:minimze for comfort 8. Labs:none 9. Telemetry:none    4. Disposition: Hopeful for in-patient hospice facility, will write for choice.  I believe his prognosis is less than a  week   3. Symptom Management:   1. Anxiety/Agitation: Ativan 1 mg IV every 6 hrs prn 2. Pain/Dyspnea: Morphine 1 mg IV every 1 hr prn  3. Bowel Regimen: Dulcolax supp  4. Psychosocial:  Emotional support offered to son at bedside, he has been his main caregiver for many years and has minimal assistance form other family members who live out of town.  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 Spiritual:  Strong community church support     Patient Documents Completed or Given: Document Given Completed  Advanced Directives Pkt    MOST  yes  DNR    Gone from My Sight    Hard Choices yes     Brief HPI: 80 year old gentleman with critical aortic stenosis, progressive physical decline over the last 6 months, immobility, frequent falls, and now new onset atrial fibrillation with rapid ventricular response and acute diastolic heart failure. He voices no complaints.  Limited  medical intervention options available to prolong quality life.   ROS: weakness, minimal interaction with this assessement    PMH:  Past Medical History  Diagnosis Date  .  Hypertension   . Hypothyroidism   . Abdominal aortic aneurysm 2006    3.9x3.6 cm per CT scan 2006  . Carotid artery stenosis   . COPD (chronic obstructive pulmonary disease)     per CT scan severe fibrosis, pt is asymptomatic  . Spinal stenosis     L3-L4  . Myocardial infarction 1996    s/p CABG  . Senile dementia, uncomplicated   . Hypertrophy  of prostate without urinary obstruction and other lower urinary tract symptoms (LUTS)   . Restless legs syndrome (RLS)   . Abdominal aneurysm without mention of rupture   . Other dyspnea and respiratory abnormality   . Slowing of urinary stream      PSH: Past Surgical History  Procedure Laterality Date  . Lumbar laminectomy      L4-L5  . Coronary artery bypass graft  1998   I have reviewed the FH and SH and  If appropriate update it with new information. No Known Allergies Scheduled Meds: . digoxin  0.0625 mg Oral Daily  . furosemide  40 mg Intravenous BID  . ipratropium  0.5 mg Nebulization TID  . metoprolol tartrate  50 mg Oral BID  . pramipexole  0.125 mg Oral QHS  . sodium chloride  3 mL Intravenous Q12H   Continuous Infusions: . sodium chloride 10 mL/hr at 08/27/13 1800   PRN Meds:.acetaminophen, LORazepam, morphine injection, ondansetron (ZOFRAN) IV, ondansetron    BP 142/100  Pulse 83  Temp(Src) 97.8 F (36.6 C) (Oral)  Resp 20  Ht 6' (1.829 m)  Wt 76.2 kg (167 lb 15.9 oz)  BMI 22.78 kg/m2  SpO2 95%   PPS:  30 % at best   Intake/Output Summary (Last 24 hours) at 08/28/13 1108 Last data filed at 08/28/13 0700  Gross per 24 hour  Intake  804.5 ml  Output    150 ml  Net  654.5 ml    Physical Exam:  General:  Ill appearing,  HEENT: mm, no exudate Chest:   Decreased in bases, CTA,  CVS:  Tachycardic and irregular Abdomen: soft NT +BS Ext: BLE trace edema Neuro:orietned to person  Labs: CBC    Component Value Date/Time   WBC 10.2 08/28/2013 0235   RBC 3.54* 08/28/2013 0235   RBC 3.54* 08/27/2013 0438   HGB 12.1* 08/28/2013 0235   HCT 37.1* 08/28/2013 0235   PLT 156 08/28/2013 0235   MCV 104.8* 08/28/2013 0235   MCH 34.2* 08/28/2013 0235   MCHC 32.6 08/28/2013 0235   RDW 14.8 08/28/2013 0235   LYMPHSABS 1.9 08/25/2013 0948   MONOABS 0.8 08/25/2013 0948   EOSABS 0.2 08/25/2013 0948   BASOSABS 0.0 08/25/2013 0948    BMET    Component Value  Date/Time   NA 144 08/28/2013 0235   K 4.5 08/28/2013 0235   CL 107 08/28/2013 0235   CO2 24 08/28/2013 0235   GLUCOSE 109* 08/28/2013 0235   GLUCOSE 97 01/18/2006   BUN 31* 08/28/2013 0235   CREATININE 1.25 08/28/2013 0235   CALCIUM 8.6 08/28/2013 0235   GFRNONAA 47* 08/28/2013 0235   GFRAA 54* 08/28/2013 0235    CMP     Component Value Date/Time   NA 144 08/28/2013 0235   K 4.5 08/28/2013 0235   CL 107 08/28/2013 0235   CO2 24 08/28/2013 0235   GLUCOSE 109* 08/28/2013 0235   GLUCOSE 97 01/18/2006   BUN 31* 08/28/2013 0235   CREATININE 1.25 08/28/2013 0235   CALCIUM 8.6 08/28/2013 0235  PROT 6.4 08/26/2013 0250   ALBUMIN 3.1* 08/26/2013 0250   AST 53* 08/26/2013 0250   ALT 18 08/26/2013 0250   ALKPHOS 76 08/26/2013 0250   BILITOT 1.2 08/26/2013 0250   GFRNONAA 47* 08/28/2013 0235   GFRAA 54* 08/28/2013 0235      Time In Time Out Total Time Spent with Patient Total Overall Time  1000 1115 70 min 75 min    Greater than 50%  of this time was spent counseling and coordinating care related to the above assessment and plan.   Lorinda Creed NP  Palliative Medicine Team Team Phone # [phone] Pager [phone]  Discussed with Dr Rito Ehrlich

## 2013-08-28 NOTE — Progress Notes (Signed)
ANTICOAGULATION CONSULT NOTE - Follow Up Consult  Pharmacy Consult for Heparin  Indication: chest pain/ACS and atrial fibrillation  No Known Allergies  Patient Measurements: Height: 6' (182.9 cm) Weight: 167 lb 15.9 oz (76.2 kg) IBW/kg (Calculated) : 77.6  Vital Signs: Temp: 98.5 F (36.9 C) (03/17 2340) Temp src: Oral (03/17 2340) BP: 142/100 mmHg (03/18 0300) Pulse Rate: 62 (03/18 0300)  Labs:  Recent Labs  08/25/13 0948  08/25/13 2200 08/26/13 0250  08/27/13 0438 08/27/13 1405 08/28/13 0235  HGB 13.0  --   --  12.4*  --  12.0*  --  12.1*  HCT 39.5  --   --  38.3*  --  37.0*  --  37.1*  PLT 165  --   --  158  --  156  --  156  APTT 41*  --   --   --   --   --   --   --   LABPROT 14.6  --   --   --   --   --   --   --   INR 1.16  --   --   --   --   --   --   --   HEPARINUNFRC  --   --   --  0.43  < > 0.11* 0.27* 0.25*  CREATININE 1.17  --   --  1.08  --  1.08  --   --   TROPONINI  --   < > 7.27* 7.89*  --  2.11*  --   --   < > = values in this interval not displayed.  Estimated Creatinine Clearance: 43.1 ml/min (by C-G formula based on Cr of 1.08).   Medications:  Heparin 1300 units/hr  Assessment: 80 y/o M on heparin for afib, long term anticoagulation not likely per MD notes, HL is 0.25 despite rate increase, other labs as above, no issues per RN.   Goal of Therapy:  Heparin level 0.3-0.7 units/ml Monitor platelets by anticoagulation protocol: Yes   Plan:  -Increase heparin to 1450 units/hr -1200 HL -Daily CBC/HL -Monitor for bleeding  Abran DukeLedford, Aarin Sparkman 08/28/2013,3:36 AM

## 2013-08-28 NOTE — Progress Notes (Signed)
[facility]  BRALIN GARRY ZOX:[id number] DOB: 1915/06/22 DOA: 08/25/2013 PCP: Lenoard Aden, NP  Brief narrative: 80 year old male patient who endorsed feeling poorly for 2-3 days prior to presentation. Patient also endorsed multiple complaints of constipation, decreased appetite and decreased energy. At 7 PM the night prior to presentation patient developed chest pain or shortness of breath which eventually resolved.  After presenting to the ER he was found to be in atrial fibrillation with rapid ventricular response. His primary cardiologist is Dr. Clarene Duke with [facility] heart and vascular. In addition to have an tachycardia patient was hypotensive at presentation. This improved with the addition of IV fluids and a Cardizem drip.  Assessment/Plan: Active Problems:   Atrial fibrillation with RVR -rate better controlled 3/16 but today up to 140s at times-pt aware of palpitations -Cards started digoxin -will further increase Lopressor PO and cont prn IV Lopressor-would like to avoid CCB in setting of low EF and acute MI -Continue IV heparin for now-doubt appropriate candidate for LT anticoagulation more than ASA and/or Plavix -Consulted cardiology who have recommended palliative care-eval in process -rate control imperative for HF control    Respiratory failure with hypoxia 2/2 edema and acute systolic heart failure -Chest x-ray done at admission question interstitial edema -new finding of systolic dysfunction this admission -Did seem dehydrated at presentation so was given IVFs but given abnormal chest x-ray in setting of atrial fibrillation and possible recent tachycardia induced cardiomyopathy IV fluids were decreased to keep open -Lasix started 3/17 without any significant diuresis-watch for orthostasis in setting of severe Ao stenosis-? Dc Lasix- will repeat CXR in am -Continue supportive care and wean oxygen as tolerated -No fever or  cough so doubt infectious etiology -**son endorsed pt with progressive DOE for >6 months    Elevated troponin/ Chest pain -Troponin thus far has peaked at greater than 7.0 with current downward trend -Discussed with patient and son and no desire to pursue invasive workup -Begin medical therapy: Beta blocker, aspirin and statin-consider add nitrate once HR controlled -ECHO reveals severe LV dysfunction (no mention of RWMA) with only mild LVH and mild LV dilatation so seems more c/w ischemic etiology   Cardiac murmur/severe Ao stenosis -As above patient with known aortic stenosis and mitral stenosis -Echo repeated this admission showed severe Ao stenosis with valve area 0.66 -once HR controlled check OVS- tight balance between dry and wet    AAA/thoracic aneurysm -Last imaging in 2006 and at that time infrarenal abdominal aneurysm measured 3.9 x 3.6 cm -abdominal ultrasound for imaging this admission infra renal AAA has increased to 6.3 -son said pt also has thoracic aneurysm- d/w him no need to image since not a surgical candidate- suspect this likely has also increased in size  -d/w son now very high risk for spotatenous rupture    HYPOTHYROIDISM -TSH normal    HYPERTENSION -BP controlled -Was not on medications prior to admission -Beta blocker started this admission for rate control and for cardiac ischemia    Restless leg syndrome -Has been a long-standing issue and difficult to control -Recently meds have been adjusted to AM Requip in p.m. Mirapex -Possibly worsened by B12 deficiency therefore check an anemia panel    Dehydration with hypernatremia -Sodium has normalized and patient appears to be euvolemic at this point so we'll KVO IV fluids -Calculated water deficit at admission was 2.9 L    CAROTID ARTERY STENOSIS, WITHOUT INFARCTION -Last carotid duplex obtained in  2010 demonstrated no significant bilateral ICA stenosis    Macrocytosis/ Anemia -Baseline hemoglobin in  2010.3 with mild macrocytosis and B12 at that time was 533 -Anemia panel within normal limits   Dementia -son endorses this as informal dx and has noticed progressive changes x 2 years -pt has not driven in 2 years -son amenable to pursue Hospice if pt has untreatable issues   DVT prophylaxis: Heparin infusion Code Status: DO NOT RESUSCITATE Family Communication: Son at bedside Disposition Plan/Expected LOS: Step down-will allow out of bed to chair (once HR controlled) and PT/OT    Consultants: Cardiology  Procedures: 2-D echocardiogram  - Left ventricle: The cavity size was mildly dilated. Wall thickness was increased in a pattern of mild LVH. Systolic function was moderately to severely reduced. The estimated ejection fraction was in the range of 30% to 35%. - Aortic valve: There was severe stenosis. Mild regurgitation. Valve area: 0.66cm^2(VTI). Valve area: 0.64cm^2 (Vmax). - Mitral valve: Moderately to severely calcified annulus. Moderately calcified leaflets . The findings are consistent with trivial stenosis. Mild regurgitation. Valve area by continuity equation (using LVOT flow): 1.58cm^2. - Left atrium: The atrium was mildly to moderately dilated. - Right ventricle: The cavity size was mildly dilated. Systolic function was mildly reduced. - Right atrium: The atrium was mildly dilated.   Abdominal aortic ultrasound pending Fusiform dilatation of the infrarenal abdominal aorta shows significant enlargement since 2006. Maximum AP diameter is 6.3 cm and transverse with is 6.2 cm. Diameter in 2006 was approximately 4.1 cm. Circumferential mural thrombus is identified in the aneurysm. No surrounding fluid collections are identified by ultrasound.   Antibiotics: None  HPI/Subjective: Patient alert and confused. Up in chair- ni reports of orthopnea today  Objective: Blood pressure 142/100, pulse 83, temperature 97.8 F (36.6 C), temperature source Oral, resp.  rate 20, height 6' (1.829 m), weight 167 lb 15.9 oz (76.2 kg), SpO2 95.00%.  Intake/Output Summary (Last 24 hours) at 08/28/13 1116 Last data filed at 08/28/13 0700  Gross per 24 hour  Intake  804.5 ml  Output    150 ml  Net  654.5 ml     Exam: General: No acute respiratory distress Lungs: Fine crackles noted upon auscultation bilaterally but more dense left mid field, 4 L Cardiovascular: Irregular rate and rhythm without gallop or rub normal S1 and S2 except for grade 2-3/6 systolic murmur right upper sternal border, no peripheral edema -5 cm JVD Abdomen: Nontender, nondistended, soft, bowel sounds positive, no rebound, no ascites, no appreciable mass Musculoskeletal: No significant cyanosis, clubbing of bilateral lower extremities Neurological: Alert and oriented x name, moves all extremities x 4 without focal neurological deficits, CN 2-12 intact-moving legs in bed and endorsing having symptoms consistent with restless leg  Scheduled Meds:  Scheduled Meds: . digoxin  0.0625 mg Oral Daily  . furosemide  40 mg Intravenous BID  . ipratropium  0.5 mg Nebulization TID  . metoprolol tartrate  50 mg Oral BID  . pramipexole  0.125 mg Oral QHS  . sodium chloride  3 mL Intravenous Q12H   Continuous Infusions: . sodium chloride 10 mL/hr at 08/27/13 1800    Data Reviewed: Basic Metabolic Panel:  Recent Labs Lab 08/25/13 0948 08/26/13 0250 08/27/13 0438 08/28/13 0235  NA 149* 147 145 144  K 4.3 4.5 4.6 4.5  CL 113* 112 108 107  CO2 22 24 23 24   GLUCOSE 125* 119* 134* 109*  BUN 28* 28* 28* 31*  CREATININE 1.17 1.08 1.08 1.25  CALCIUM 9.1 8.7 8.8 8.6   Liver Function Tests:  Recent Labs Lab 08/26/13 0250  AST 53*  ALT 18  ALKPHOS 76  BILITOT 1.2  PROT 6.4  ALBUMIN 3.1*   No results found for this basename: LIPASE, AMYLASE,  in the last 168 hours No results found for this basename: AMMONIA,  in the last 168 hours CBC:  Recent Labs Lab 08/25/13 0948  08/26/13 0250 08/27/13 0438 08/28/13 0235  WBC 9.0 9.0 8.8 10.2  NEUTROABS 6.1  --   --   --   HGB 13.0 12.4* 12.0* 12.1*  HCT 39.5 38.3* 37.0* 37.1*  MCV 105.3* 104.9* 104.5* 104.8*  PLT 165 158 156 156   Cardiac Enzymes:  Recent Labs Lab 08/25/13 1555 08/25/13 2200 08/26/13 0250 08/27/13 0438  TROPONINI 2.81* 7.27* 7.89* 2.11*   BNP (last 3 results) No results found for this basename: PROBNP,  in the last 8760 hours CBG: No results found for this basename: GLUCAP,  in the last 168 hours  Recent Results (from the past 240 hour(s))  MRSA PCR SCREENING     Status: None   Collection Time    08/25/13  5:16 PM      Result Value Ref Range Status   MRSA by PCR NEGATIVE  NEGATIVE Final   Comment:            The GeneXpert MRSA Assay (FDA     approved for NASAL specimens     only), is one component of a     comprehensive MRSA colonization     surveillance program. It is not     intended to diagnose MRSA     infection nor to guide or     monitor treatment for     MRSA infections.     Studies:  Recent x-ray studies have been reviewed in detail by the Attending Physician  Time spent :     Junious Silk, ANP [facility] Office  [phone] Pager [phone]  **If unable to reach the above provider after paging please contact the Flow Manager @ [phone]  On-Call/Text Page:      [email or web address]      password [email or web address]  If 7PM-7AM, please contact night-coverage [email or web address] Password [email or web address] 08/28/2013, 11:16 AM   LOS: 3 days

## 2013-08-28 NOTE — Progress Notes (Signed)
Patient seen, examined and discussed with my nurse practitioner. Agree with above and assessment. However, new plan after meeting with palliative care he is given very short life expectancy, patient will be made comfort care. [facility]. Limiting medications to those for aiding in breathing plus pain control. Discussed with cardiology who are signing off.

## 2013-08-28 NOTE — Progress Notes (Signed)
Signing off ?

## 2013-08-28 NOTE — Progress Notes (Signed)
Pt to tx to 6N07. Report called to LuckyPatty, Charity fundraiserN.  Son, Trey PaulaJeff, notified of room change.  Pts belongings packed.  Pt transferred to [address]N07 via WC on O2.  Crissa Sowder, [facility] Hope

## 2013-08-29 ENCOUNTER — Inpatient Hospital Stay (HOSPITAL_COMMUNITY): Payer: Medicare Other

## 2013-08-29 MED ORDER — ALBUTEROL SULFATE (2.5 MG/3ML) 0.083% IN NEBU: 2.5000 mg | INHALATION_SOLUTION | RESPIRATORY_TRACT | Status: DC | PRN

## 2013-08-29 MED ORDER — ROPINIROLE HCL 1 MG PO TABS
1.0000 mg | ORAL_TABLET | Freq: Every day | ORAL | Status: DC
  Administered 2013-08-29: 1 mg via ORAL
  Filled 2013-08-29 (×4): qty 1

## 2013-08-29 NOTE — Consult Note (Signed)
I have reviewed this case with our NP and agree with the Assessment and Plan as stated.  Jaiyla Granados L. Hajime Asfaw, MD MBA The Palliative Medicine Team at [facility] Team Phone: [phone] Pager: [phone]   

## 2013-08-29 NOTE — Progress Notes (Signed)
Progress Note  Vernon English:[id number] DOB: [birth year] DOA: 08/25/2013 PCP: Lenoard Aden, NP  Brief narrative: 80 year old male patient who endorsed feeling poorly for 2-3 days prior to presentation. Patient also endorsed multiple complaints of constipation, decreased appetite and decreased energy. At 7 PM the night prior to presentation patient developed chest pain or shortness of breath which eventually resolved.  After presenting to the ER he was found to be in atrial fibrillation with rapid ventricular response. His primary cardiologist is Dr. Clarene Duke with [facility] heart and vascular. In addition to have an tachycardia patient was hypotensive at presentation. This improved with the addition of IV fluids and a Cardizem drip.  Assessment/Plan: Comfort care -morphine -palliaitve care -await bed inpatient hospice    Atrial fibrillation with RVR -lasix -dig while in house    Respiratory failure with hypoxia 2/2 edema and acute systolic heart failure    Elevated troponin/ Chest pain   Cardiac murmur/severe Ao stenosis    AAA/thoracic aneurysm    HYPOTHYROIDISM    HYPERTENSION    Restless leg syndrome     Dehydration with hypernatremia    CAROTID ARTERY STENOSIS, WITHOUT INFARCTION    Macrocytosis/ Anemia   Dementia    DVT prophylaxis: Heparin infusion Code Status: DO NOT RESUSCITATE Family Communication: no family at bedside Disposition Plan/Expected LOS: await inpt hospice bed   Consultants: Cardiology palliative care  Procedures: 2-D echocardiogram  - Left ventricle: The cavity size was mildly dilated. Wall thickness was increased in a pattern of mild LVH. Systolic function was moderately to severely reduced. The estimated ejection fraction was in the range of 30% to 35%. - Aortic valve: There was severe stenosis. Mild regurgitation. Valve area: 0.66cm^2(VTI). Valve area: 0.64cm^2 (Vmax). - Mitral valve: Moderately to severely  calcified annulus. Moderately calcified leaflets . The findings are consistent with trivial stenosis. Mild regurgitation. Valve area by continuity equation (using LVOT flow): 1.58cm^2. - Left atrium: The atrium was mildly to moderately dilated. - Right ventricle: The cavity size was mildly dilated. Systolic function was mildly reduced. - Right atrium: The atrium was mildly dilated.   Abdominal aortic ultrasound pending Fusiform dilatation of the infrarenal abdominal aorta shows significant enlargement since 2006. Maximum AP diameter is 6.3 cm and transverse with is 6.2 cm. Diameter in 2006 was approximately 4.1 cm. Circumferential mural thrombus is identified in the aneurysm. No surrounding fluid collections are identified by ultrasound.   Antibiotics: None  HPI/Subjective: Patient alert and confused. Up in chair- ni reports of orthopnea today  Objective: Blood pressure 98/64, pulse 85, temperature 97.7 F (36.5 C), temperature source Oral, resp. rate 17, height 6' (1.829 m), weight 76.2 kg (167 lb 15.9 oz), SpO2 94.00%.  Intake/Output Summary (Last 24 hours) at 08/29/13 0813 Last data filed at 08/29/13 0741  Gross per 24 hour  Intake 1153.5 ml  Output    600 ml  Net  553.5 ml     Exam: General: resting, eyes closed, no increased work of breathing Lungs: Fine crackles noted upon auscultation bilaterally but more dense left mid field, 4 L Cardiovascular: Irregular rate and rhythm without gallop or rub normal S1 and S2 except for grade 2-3/6 systolic murmur right upper sternal border, no peripheral edema  Abdomen: Nontender, nondistended, soft, bowel sounds positive, no rebound, no ascites, no appreciable mass   Scheduled Meds:  Scheduled Meds: . digoxin  0.0625 mg Oral Daily  . furosemide  40 mg Intravenous BID  . ipratropium  0.5 mg Nebulization  TID  . metoprolol tartrate  50 mg Oral BID  . pramipexole  0.125 mg Oral QHS  . sodium chloride  3 mL Intravenous Q12H     Continuous Infusions: . sodium chloride Stopped (08/28/13 1200)    Data Reviewed: Basic Metabolic Panel:  Recent Labs Lab 08/25/13 0948 08/26/13 0250 08/27/13 0438 08/28/13 0235  NA 149* 147 145 144  K 4.3 4.5 4.6 4.5  CL 113* 112 108 107  CO2 22 24 23 24   GLUCOSE 125* 119* 134* 109*  BUN 28* 28* 28* 31*  CREATININE 1.17 1.08 1.08 1.25  CALCIUM 9.1 8.7 8.8 8.6   Liver Function Tests:  Recent Labs Lab 08/26/13 0250  AST 53*  ALT 18  ALKPHOS 76  BILITOT 1.2  PROT 6.4  ALBUMIN 3.1*   No results found for this basename: LIPASE, AMYLASE,  in the last 168 hours No results found for this basename: AMMONIA,  in the last 168 hours CBC:  Recent Labs Lab 08/25/13 0948 08/26/13 0250 08/27/13 0438 08/28/13 0235  WBC 9.0 9.0 8.8 10.2  NEUTROABS 6.1  --   --   --   HGB 13.0 12.4* 12.0* 12.1*  HCT 39.5 38.3* 37.0* 37.1*  MCV 105.3* 104.9* 104.5* 104.8*  PLT 165 158 156 156   Cardiac Enzymes:  Recent Labs Lab 08/25/13 1555 08/25/13 2200 08/26/13 0250 08/27/13 0438  TROPONINI 2.81* 7.27* 7.89* 2.11*   BNP (last 3 results) No results found for this basename: PROBNP,  in the last 8760 hours CBG: No results found for this basename: GLUCAP,  in the last 168 hours  Recent Results (from the past 240 hour(s))  MRSA PCR SCREENING     Status: None   Collection Time    08/25/13  5:16 PM      Result Value Ref Range Status   MRSA by PCR NEGATIVE  NEGATIVE Final   Comment:            The GeneXpert MRSA Assay (FDA     approved for NASAL specimens     only), is one component of a     comprehensive MRSA colonization     surveillance program. It is not     intended to diagnose MRSA     infection nor to guide or     monitor treatment for     MRSA infections.     Studies:  Recent x-ray studies have been reviewed in detail by the Attending Physician  Time spent :     Junious Silkllison Ellis, ANP [facility] Office  [phone] Pager [phone]  **If  unable to reach the above provider after paging please contact the Flow Manager @ [phone]  On-Call/Text Page:      [email or web address]      password [email or web address]  If 7PM-7AM, please contact night-coverage [email or web address] Password [email or web address] 08/29/2013, 8:13 AM   LOS: 4 days

## 2013-08-29 NOTE — Progress Notes (Signed)
Progress Note from the Palliative Medicine Team at [facility]  Subjective:  -patient is resting quietly and comfortably in bed  -spoke with son at bedside, plan is for full comfort, all are comfortable with this decsion     Objective: No Known Allergies Scheduled Meds: . digoxin  0.0625 mg Oral Daily  . furosemide  40 mg Intravenous BID  . ipratropium  0.5 mg Nebulization TID  . metoprolol tartrate  50 mg Oral BID  . pramipexole  0.125 mg Oral QHS  . rOPINIRole  1 mg Oral Daily  . sodium chloride  3 mL Intravenous Q12H   Continuous Infusions: . sodium chloride Stopped (08/28/13 1200)   PRN Meds:.acetaminophen, bisacodyl, LORazepam, morphine injection, ondansetron (ZOFRAN) IV, ondansetron  BP 98/64  Pulse 85  Temp(Src) 97.7 F (36.5 C) (Oral)  Resp 17  Ht 6' (1.829 m)  Wt 76.2 kg (167 lb 15.9 oz)  BMI 22.78 kg/m2  SpO2 94%   PPS:30 % at best  Pain Score:denies   Intake/Output Summary (Last 24 hours) at 08/29/13 1153 Last data filed at 08/29/13 0741  Gross per 24 hour  Intake   1080 ml  Output    300 ml  Net    780 ml       Physical Exam:   General: Ill appearing,  HEENT: mm, no exudate  Chest: Decreased in bases, CTA,  CVS: Tachycardic and irregular  Abdomen: soft NT +BS  Ext: BLE trace edema  Neuro:orietned to person   Labs: CBC    Component Value Date/Time   WBC 10.2 08/28/2013 0235   RBC 3.54* 08/28/2013 0235   RBC 3.54* 08/27/2013 0438   HGB 12.1* 08/28/2013 0235   HCT 37.1* 08/28/2013 0235   PLT 156 08/28/2013 0235   MCV 104.8* 08/28/2013 0235   MCH 34.2* 08/28/2013 0235   MCHC 32.6 08/28/2013 0235   RDW 14.8 08/28/2013 0235   LYMPHSABS 1.9 08/25/2013 0948   MONOABS 0.8 08/25/2013 0948   EOSABS 0.2 08/25/2013 0948   BASOSABS 0.0 08/25/2013 0948    BMET    Component Value Date/Time   NA 144 08/28/2013 0235   K 4.5 08/28/2013 0235   CL 107 08/28/2013 0235   CO2 24 08/28/2013 0235   GLUCOSE 109* 08/28/2013 0235   GLUCOSE 97 01/18/2006   BUN 31*  08/28/2013 0235   CREATININE 1.25 08/28/2013 0235   CALCIUM 8.6 08/28/2013 0235   GFRNONAA 47* 08/28/2013 0235   GFRAA 54* 08/28/2013 0235    CMP     Component Value Date/Time   NA 144 08/28/2013 0235   K 4.5 08/28/2013 0235   CL 107 08/28/2013 0235   CO2 24 08/28/2013 0235   GLUCOSE 109* 08/28/2013 0235   GLUCOSE 97 01/18/2006   BUN 31* 08/28/2013 0235   CREATININE 1.25 08/28/2013 0235   CALCIUM 8.6 08/28/2013 0235   PROT 6.4 08/26/2013 0250   ALBUMIN 3.1* 08/26/2013 0250   AST 53* 08/26/2013 0250   ALT 18 08/26/2013 0250   ALKPHOS 76 08/26/2013 0250   BILITOT 1.2 08/26/2013 0250   GFRNONAA 47* 08/28/2013 0235   GFRAA 54* 08/28/2013 0235     Assessment and Plan: 1. Code Status:DNR/DNI-comfort is main focus of care 2. Symptom Control: 1. Anxiety/Agitation: Ativan 1 mg IV every 6 hrs prn 2. Pain/Dyspnea: Morphine 1 mg IV every 1 hr prn  3. Bowel Regimen: Dulcolax supp  3. Psycho/Social:  Emotional support offered 4. Spiritual  Strong community church support 5. Disposition:Hopeful for residential  hospice  Patient Documents Completed or Given: Document Given Completed  Advanced Directives Pkt    MOST  yes  DNR    Gone from [facility]    Hard Choices yes     Lorinda CreedMary Larach NP  Palliative Medicine Team Team Phone # [phone] Pager [phone]   1

## 2013-08-29 NOTE — Social Work (Signed)
CSW contacted to speak with son about hospice home options- provided son with list of options and he has indicated [facility] and [facility] he is quite [facility] as this would be convenient for friends to visit- Referral made to Forrestine HimEva Davis for [facility] review and consideration- CS[facility] will [facility]- full assessment to follow.  Reece LevyJanet Dierdra Salameh, MSW, Theresia MajorsLCSWA [phone]

## 2013-08-30 DIAGNOSIS — G2581 Restless legs syndrome: Secondary | ICD-10-CM

## 2013-08-30 MED ORDER — LORAZEPAM 1 MG PO TABS: 1.0000 mg | ORAL_TABLET | Freq: Four times a day (QID) | ORAL | Status: AC | PRN

## 2013-08-30 MED ORDER — ONDANSETRON HCL 4 MG PO TABS: 4.0000 mg | ORAL_TABLET | Freq: Four times a day (QID) | ORAL | Status: AC | PRN

## 2013-08-30 MED ORDER — ROPINIROLE HCL 1 MG PO TABS: 1.0000 mg | ORAL_TABLET | Freq: Every day | ORAL | Status: AC

## 2013-08-30 MED ORDER — BISACODYL 10 MG RE SUPP: 10.0000 mg | Freq: Every day | RECTAL | Status: AC | PRN

## 2013-08-30 MED ORDER — ROPINIROLE HCL 1 MG PO TABS
1.0000 mg | ORAL_TABLET | Freq: Every day | ORAL | Status: DC
  Administered 2013-08-30: 1 mg via ORAL
  Filled 2013-08-30: qty 1

## 2013-08-30 MED ORDER — DIGOXIN 62.5 MCG PO TABS: 0.0625 mg | ORAL_TABLET | Freq: Every day | ORAL | Status: AC

## 2013-08-30 MED ORDER — ACETAMINOPHEN 325 MG PO TABS: 650.0000 mg | ORAL_TABLET | Freq: Four times a day (QID) | ORAL | Status: AC | PRN

## 2013-08-30 MED ORDER — MORPHINE SULFATE (CONCENTRATE) 10 MG /0.5 ML PO SOLN: 10.0000 mg | ORAL | Status: AC | PRN

## 2013-08-30 MED ORDER — METOPROLOL TARTRATE 12.5 MG HALF TABLET: 50.0000 mg | ORAL_TABLET | Freq: Two times a day (BID) | ORAL | Status: AC

## 2013-08-30 MED ORDER — ALBUTEROL SULFATE (2.5 MG/3ML) 0.083% IN NEBU: 2.5000 mg | INHALATION_SOLUTION | RESPIRATORY_TRACT | Status: AC | PRN

## 2013-08-30 NOTE — Progress Notes (Signed)
EMS here to take pt. Vernon English, Vernon English

## 2013-08-30 NOTE — Discharge Summary (Signed)
Physician Discharge Summary  Vernon English ZOX:[id number] DOB: 01-11-1916 DOA: 08/25/2013  PCP: Lenoard Aden, NP  Admit date: 08/25/2013 Discharge date: 08/30/2013  Time spent: greater than 30 minutes  Discharge Diagnoses:  Active Problems:   HYPOTHYROIDISM   HYPERTENSION   CAROTID ARTERY STENOSIS, WITHOUT INFARCTION   ABDOMINAL AORTIC ANEURYSM   Restless leg   Atrial fibrillation with RVR   Dehydration with hypernatremia   Macrocytosis   Anemia   Respiratory failure with hypoxia   NSTEMI (non-ST elevated myocardial infarction)   Chest pain   Acute systolic congestive heart failure, NYHA class 3   Severe aortic stenosis   Palliative care encounter hypotension debility  Discharge Condition: stable to residential hospice  [facility] Weights   08/25/13 1557 08/26/13 0200  Weight: 81.647 kg (180 lb) 76.2 kg (167 lb 15.9 oz)   HPI/Hospital Course:  80 year old male patient who endorsed feeling poorly for 2-3 days prior to presentation. Patient also endorsed multiple complaints of constipation, decreased appetite and decreased energy. At 7 PM the night prior to presentation patient developed chest pain or shortness of breath which eventually resolved.  After presenting to the ER he was found to be in atrial fibrillation with rapid ventricular response. His primary cardiologist is Dr. Clarene Duke with [facility] heart and vascular. In addition to have an tachycardia patient was hypotensive at presentation. This improved with the addition of IV fluids and a Cardizem drip. Ruled in for MI.  Echo showed severe AS, EF 30-35%. Prognosis deemed poor due to advanced age, multiple medical problems severe debility.  Palliative care consulted and patient was made comfort measures only. He will transfer to residential hospice today.  Atrial fibrillation with RVR  Started on digoxin and metoprolol for rate control. Not an anticoagulation candidate.  Respiratory failure with hypoxia 2/2 edema  and acute systolic heart failure  Comfortable on supplemental oxygen  NSTEMI: Not an interventional candidate  Cardiac murmur/severe Ao stenosis  Not an interventional candidate  AAA/thoracic aneurysm  Nonoperative candidate  HYPOTHYROIDISM  -TSH normal   Restless leg syndrome  Continue home regimen of Requip and Mirapex  Dehydration with hypernatremia  On admission. Now euvolemic  Dementia  No behavioral problems drain hospitalization.  Consultants:  Cardiology   Discharge Exam: Filed Vitals:   08/30/13 0555  BP: 110/86  Pulse: 129  Temp: 97.9 F (36.6 C)  Resp: 16    General: Comfortable. Pleasantly confused. Cardiovascular: Irregularly irregular with systolic murmur Respiratory: Clear to auscultation bilaterally without wheezes rhonchi or rales Extremities: Restless legs. No edema.  Discharge Instructions  Discharge Orders   Future Orders Complete By Expires   Diet general  As directed    Walk with assistance  As directed        Medication List    STOP taking these medications       CENTRUM SILVER PO      TAKE these medications       acetaminophen 325 MG tablet  Commonly known as:  TYLENOL  Take 2 tablets (650 mg total) by mouth every 6 (six) hours as needed for mild pain.     albuterol (2.5 MG/3ML) 0.083% nebulizer solution  Commonly known as:  PROVENTIL  Take 3 mLs (2.5 mg total) by nebulization every 2 (two) hours as needed for wheezing or shortness of breath.     bisacodyl 10 MG suppository  Commonly known as:  DULCOLAX  Place 1 suppository (10 mg total) rectally daily as needed for moderate constipation.  Digoxin 62.5 MCG Tabs  Take 0.0625 mg by mouth daily.     LORazepam 1 MG tablet  Commonly known as:  ATIVAN  Take 1 tablet (1 mg total) by mouth every 6 (six) hours as needed for anxiety.     metoprolol tartrate 12.5 mg Tabs tablet  Commonly known as:  LOPRESSOR  Take 2 tablets (50 mg total) by mouth 2 (two) times daily.      morphine CONCENTRATE 10 mg / 0.5 ml concentrated solution  Take 0.5 mLs (10 mg total) by mouth every 3 (three) hours as needed for shortness of breath (or discomfort).     ondansetron 4 MG tablet  Commonly known as:  ZOFRAN  Take 1 tablet (4 mg total) by mouth every 6 (six) hours as needed for nausea.     pramipexole 0.125 MG tablet  Commonly known as:  MIRAPEX  Take one tablet by mouth at bedtime for leg pains     rOPINIRole 1 MG tablet  Commonly known as:  REQUIP  Take 1 tablet (1 mg total) by mouth daily with breakfast.       No Known Allergies    The results of significant diagnostics from this hospitalization (including imaging, microbiology, ancillary and laboratory) are listed below for reference.    Significant Diagnostic Studies: US Aorta  08/26/2013   CLINICAL DATA:  Known abdominal aortic aneurysm.  EXAM: ULTRASOUND OF ABDOMINAL AORTA  TECHNIQUE: Ultrasound examination of the abdominal aorta was performed to evaluate for abdominal aortic aneurysm.  COMPARISON:  MR ABDOMEN WO/W CM dated 03/27/2005; US AORTA dated 12/15/2004  FINDINGS: Abdominal Aorta  Fusiform dilatation of the infrarenal abdominal aorta shows significant enlargement since 2006. Maximum AP diameter is 6.3 cm and transverse with is 6.2 cm. Diameter in 2006 was approximately 4.1 cm. Circumferential mural thrombus is identified in the aneurysm. No surrounding fluid collections are identified by ultrasound.  IMPRESSION: Enlargement of infrarenal abdominal aortic aneurysm with maximal diameter of 6.3 cm.   Electronically Signed   By: Irish Lack M.D.   On: 08/26/2013 15:06   Dg Chest Port 1 View  08/29/2013   CLINICAL DATA:  Cough and edema.  EXAM: PORTABLE CHEST - 1 VIEW  COMPARISON:  CT chest 02/17/2014. Single view of the chest 08/25/2013 and 08/08/2008.  FINDINGS: Extensive interstitial fibrosis is present. There is likely superimposed interstitial edema. Cardiomegaly is noted.  IMPRESSION: Extensive  interstitial fibrosis with superimposed interstitial edema.   Electronically Signed   By: Drusilla Kanner M.D.   On: 08/29/2013 03:56   Dg Chest Port 1 View  08/25/2013   CLINICAL DATA:  Left-sided chest pain and chronic shortness of breath with exertion. Remote CABG.  EXAM: PORTABLE CHEST - 1 VIEW  COMPARISON:  DG CHEST 1V PORT dated 08/08/2008; DG CHEST 2 VIEW dated 12/13/2004; CT CHEST W/CM dated 02/17/2005  FINDINGS: Evidence of CABG noted with moderate enlargement of cardiomediastinal silhouette. There are diffusely prominent interstitial reticular markings with suggestion of Kerley B lines and trace pleural effusions. No focal parenchymal consolidation.  IMPRESSION: New probable interstitial edema or atypical infection/inflammatory process. If symptoms persist, consider PA and lateral chest radiographs obtained at full inspiration when the patient is clinically able.   Electronically Signed   By: Christiana Pellant M.D.   On: 08/25/2013 11:55   Echocardiogram  2-D echocardiogram  - Left ventricle: The cavity size was mildly dilated. Wall thickness was increased in a pattern of mild LVH. Systolic function was moderately to severely reduced. The  estimated ejection fraction was in the range of 30% to 35%. - Aortic valve: There was severe stenosis. Mild regurgitation. Valve area: 0.66cm^2(VTI). Valve area: 0.64cm^2 (Vmax). - Mitral valve: Moderately to severely calcified annulus. Moderately calcified leaflets . The findings are consistent with trivial stenosis. Mild regurgitation. Valve area by continuity equation (using LVOT flow): 1.58cm^2. - Left atrium: The atrium was mildly to moderately dilated. - Right ventricle: The cavity size was mildly dilated. Systolic function was mildly reduced. - Right atrium: The atrium was mildly dilated.  EKG Atrial fibrillation with rapid ventricular response Left axis deviation Septal infarct , age undetermined Marked ST abnormality, possible inferolateral  subendocardial injury Abnormal ECG Microbiology: Recent Results (from the past 240 hour(s))  MRSA PCR SCREENING     Status: None   Collection Time    08/25/13  5:16 PM      Result Value Ref Range Status   MRSA by PCR NEGATIVE  NEGATIVE Final   Comment:            The GeneXpert MRSA Assay (FDA     approved for NASAL specimens     only), is one component of a     comprehensive MRSA colonization     surveillance program. It is not     intended to diagnose MRSA     infection nor to guide or     monitor treatment for     MRSA infections.     Labs: Basic Metabolic Panel:  Recent Labs Lab 08/25/13 0948 08/26/13 0250 08/27/13 0438 08/28/13 0235  NA 149* 147 145 144  K 4.3 4.5 4.6 4.5  CL 113* 112 108 107  CO2 22 24 23 24   GLUCOSE 125* 119* 134* 109*  BUN 28* 28* 28* 31*  CREATININE 1.17 1.08 1.08 1.25  CALCIUM 9.1 8.7 8.8 8.6   Liver Function Tests:  Recent Labs Lab 08/26/13 0250  AST 53*  ALT 18  ALKPHOS 76  BILITOT 1.2  PROT 6.4  ALBUMIN 3.1*   No results found for this basename: LIPASE, AMYLASE,  in the last 168 hours No results found for this basename: AMMONIA,  in the last 168 hours CBC:  Recent Labs Lab 08/25/13 0948 08/26/13 0250 08/27/13 0438 08/28/13 0235  WBC 9.0 9.0 8.8 10.2  NEUTROABS 6.1  --   --   --   HGB 13.0 12.4* 12.0* 12.1*  HCT 39.5 38.3* 37.0* 37.1*  MCV 105.3* 104.9* 104.5* 104.8*  PLT 165 158 156 156   Cardiac Enzymes:  Recent Labs Lab 08/25/13 1555 08/25/13 2200 08/26/13 0250 08/27/13 0438  TROPONINI 2.81* 7.27* 7.89* 2.11*   BNP: BNP (last 3 results) No results found for this basename: PROBNP,  in the last 8760 hours CBG: No results found for this basename: GLUCAP,  in the last 168 hours     Signed:  Imani Sherrin L  [facility] 08/30/2013, 10:19 AM

## 2013-08-30 NOTE — Progress Notes (Signed)
Given report to nurse at [facility].  All questions answered.  Awaiting ambulance.  Vanice Sarahhompson, Selmer Adduci L

## 2013-08-30 NOTE — Progress Notes (Signed)
Hospice nurse requests to leave IV in.   Vanice Sarahhompson, Tamy Accardo L

## 2013-08-30 NOTE — Social Work (Signed)
Bed offered and [facility] and [facility]- son quite tearful prior to transfer- he has been primary caregiver and has had dad living with him for the past 16 years- son spent some time reminiscing with me about his father- he is at peace with the plans and appreciative of CSW and hospice liason's support and assistance-  Reece LevyJanet Mayrin Schmuck, MSW, [employer] IncLCSWA [phone]

## 2013-08-30 NOTE — Consult Note (Signed)
[facility] Liaison: Received request from CSW for [facility]/29/[facility]. CSW aware at that time no [facility]. Unfortunately still no [facility] availability and nothing expected to change over weekend. Will followup with CSW if availability changes for this patient over weekend. Thank you. Forrestine Himva Clarise Chacko LCSW [phone]

## 2013-09-11 DEATH — deceased

## 2014-08-22 IMAGING — US US AORTA
1 series · 14 of 25 positions shown · non-contrast
Comparison: MR ABDOMEN WO/W CM dated 03/27/2005; US AORTA dated
12/15/2004

CLINICAL DATA: Known abdominal aortic aneurysm.

EXAM:
ULTRASOUND OF ABDOMINAL AORTA
TECHNIQUE: Ultrasound examination of the abdominal aorta was performed to
evaluate for abdominal aortic aneurysm.

[Series 1: us aorta · 0.37mm/px · 14 of 25 slices shown]
[im 1/25]
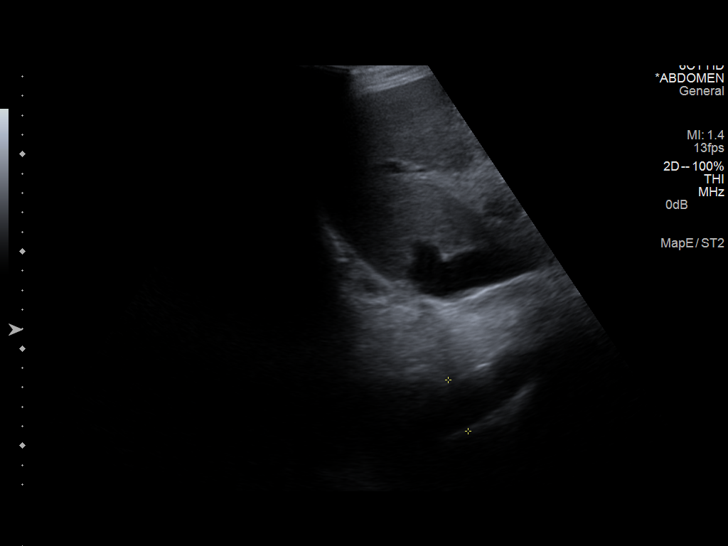
[im 3/25]
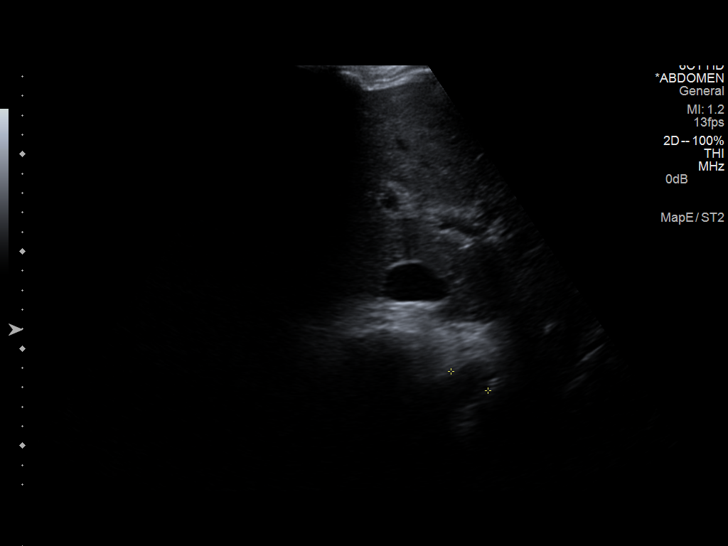
[im 5/25]
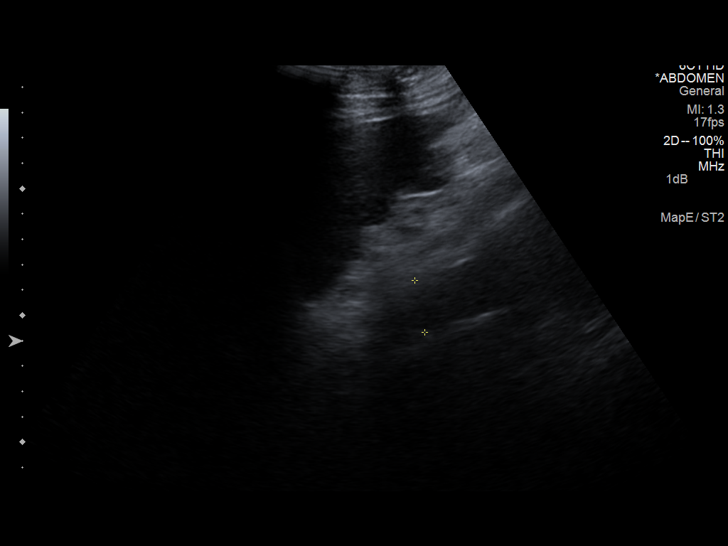
[im 7/25]
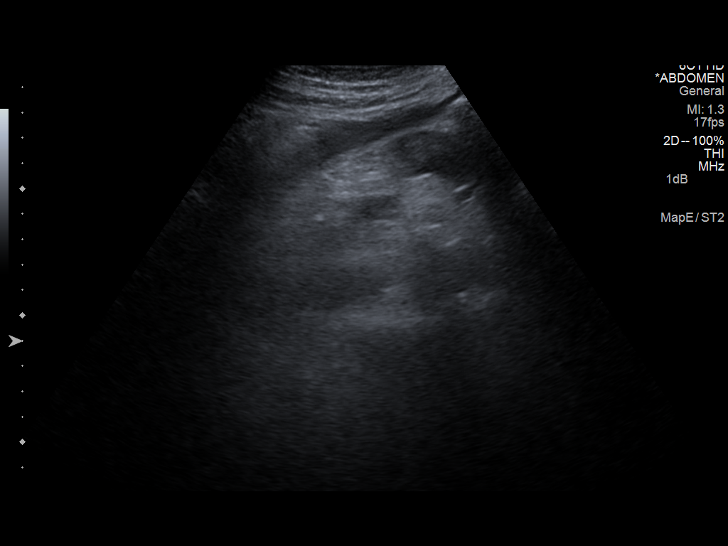
[im 9/25]
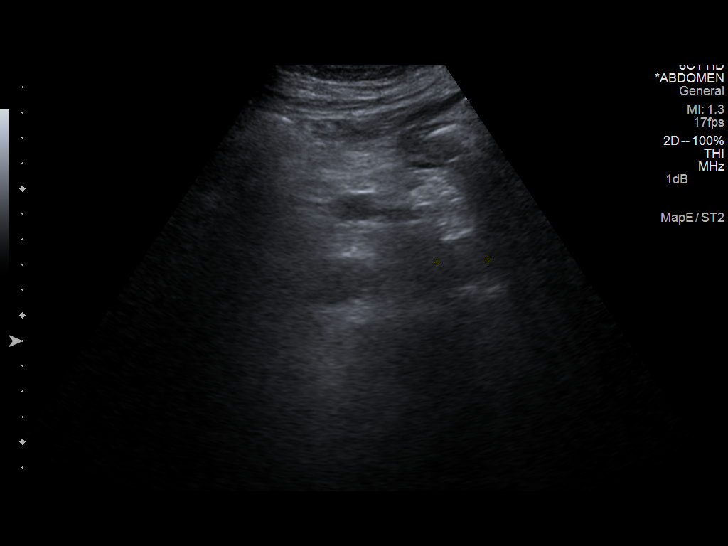
[im 10/25]
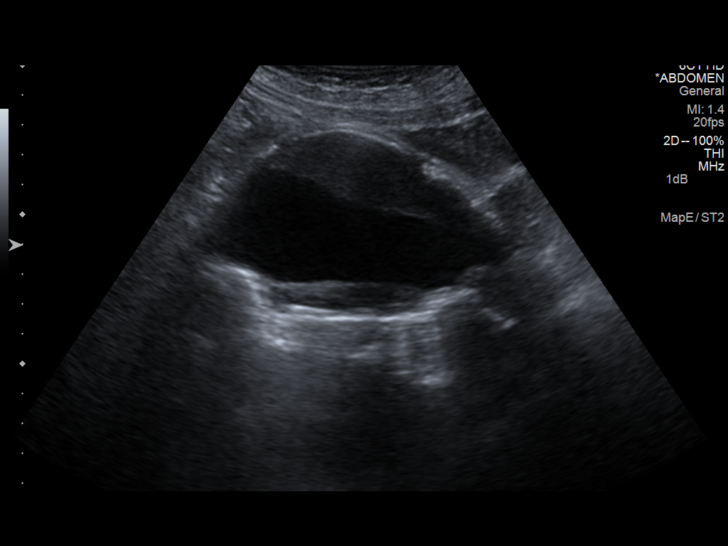
[im 12/25]
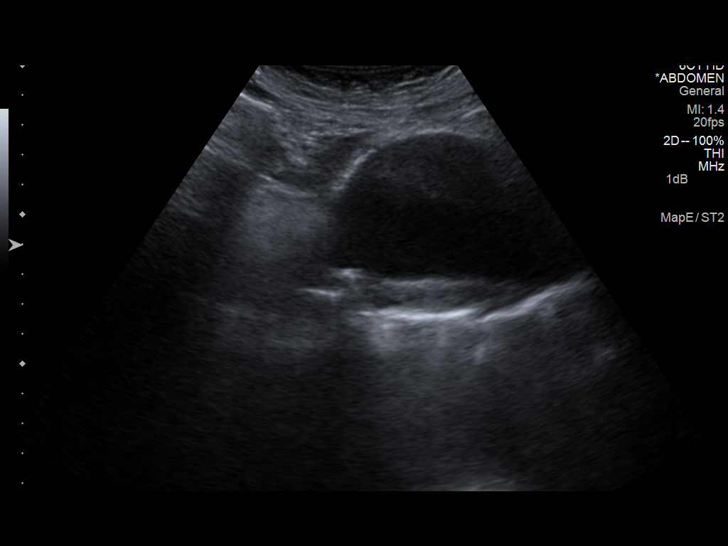
[im 14/25]
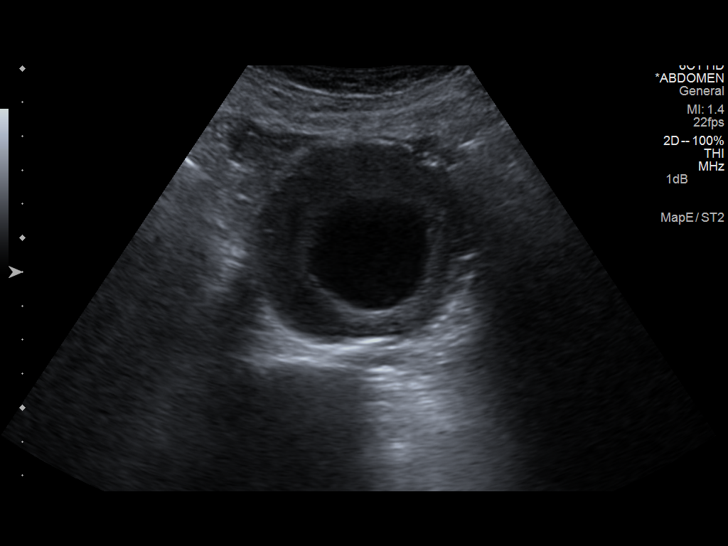
[im 16/25]
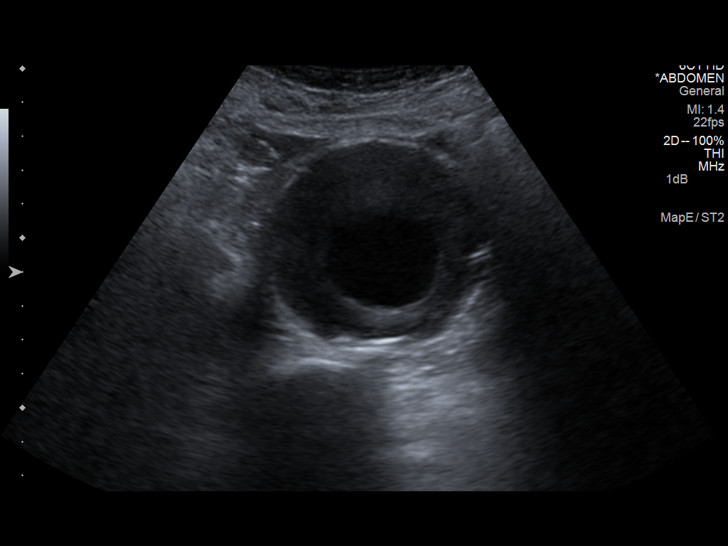
[im 17/25]
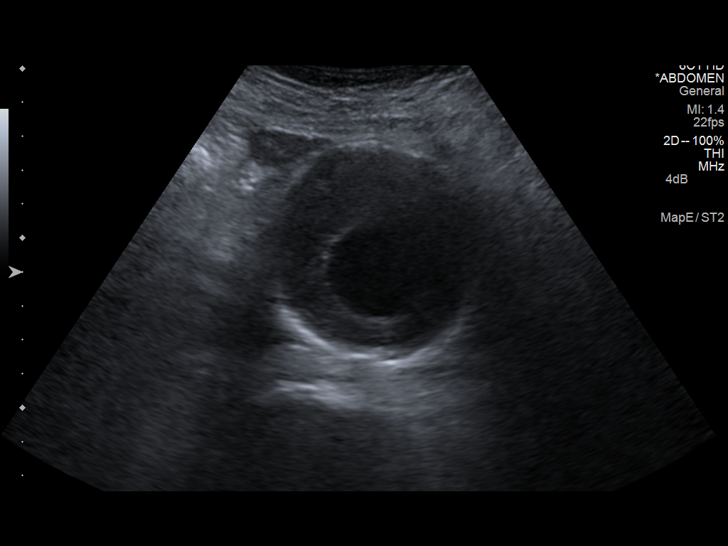
[im 19/25]
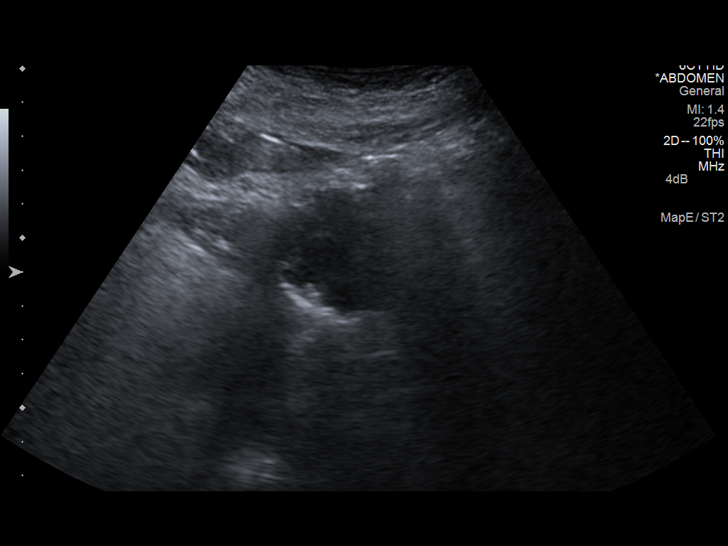
[im 21/25]
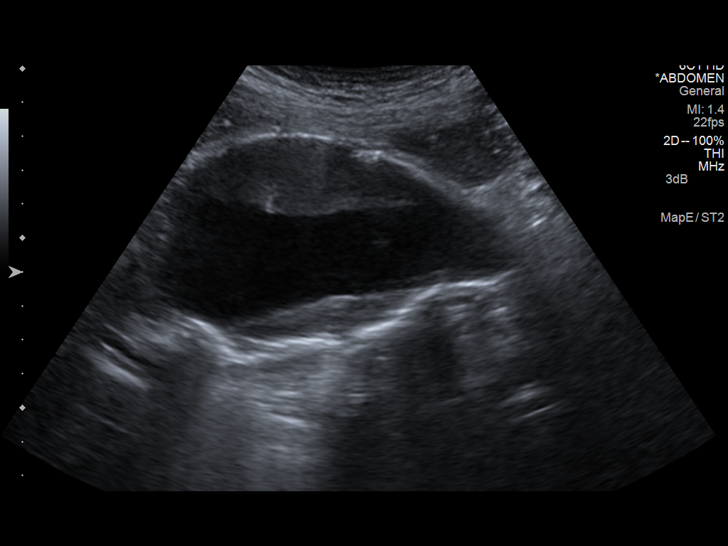
[im 23/25]
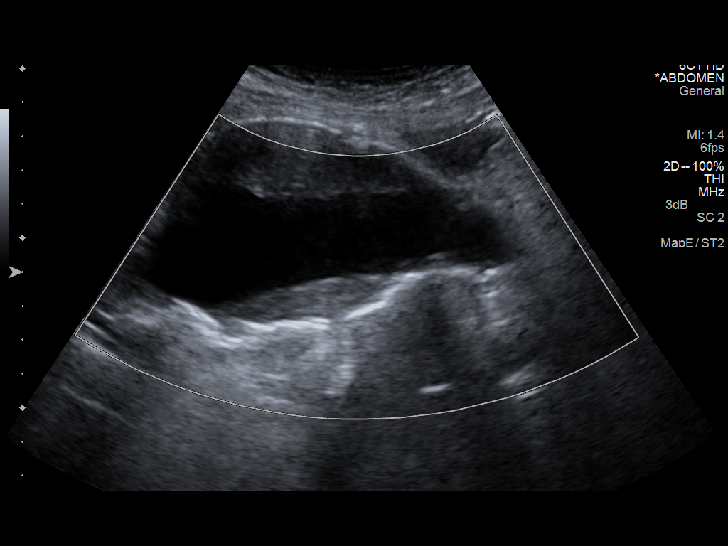
[im 25/25]
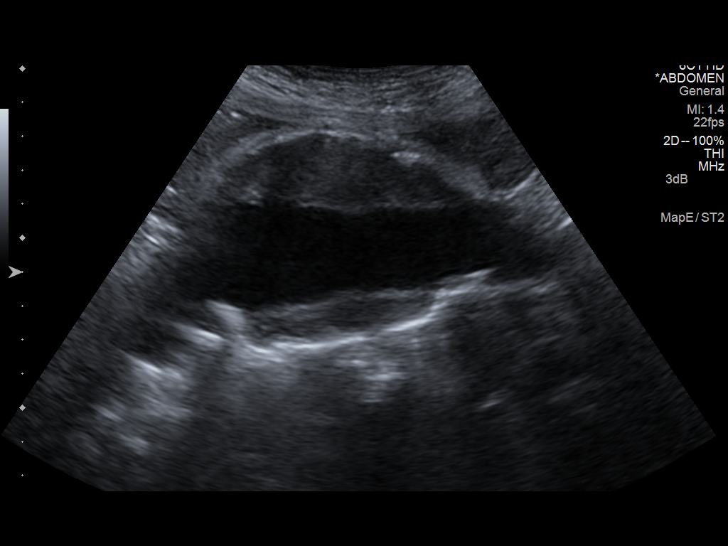

[14 of 25 positions shown; findings below may reference images not displayed]

FINDINGS: Abdominal Aorta

Fusiform dilatation of the infrarenal abdominal aorta shows
significant enlargement since 5442. Maximum AP diameter is 6.3 cm
and transverse with is 6.2 cm. Diameter in 5442 was approximately
4.1 cm. Circumferential mural thrombus is identified in the
aneurysm. No surrounding fluid collections are identified by
ultrasound.
IMPRESSION: Enlargement of infrarenal abdominal aortic aneurysm with maximal
diameter of 6.3 cm.

## 2014-08-25 IMAGING — DX DG CHEST 1V PORT
1 series · 1 of 1 positions shown · non-contrast
Comparison: CT chest 02/17/2014. Single view of the chest
08/25/2013 and 08/08/2008.

CLINICAL DATA: Cough and edema.

EXAM:
PORTABLE CHEST - 1 VIEW

[portable]
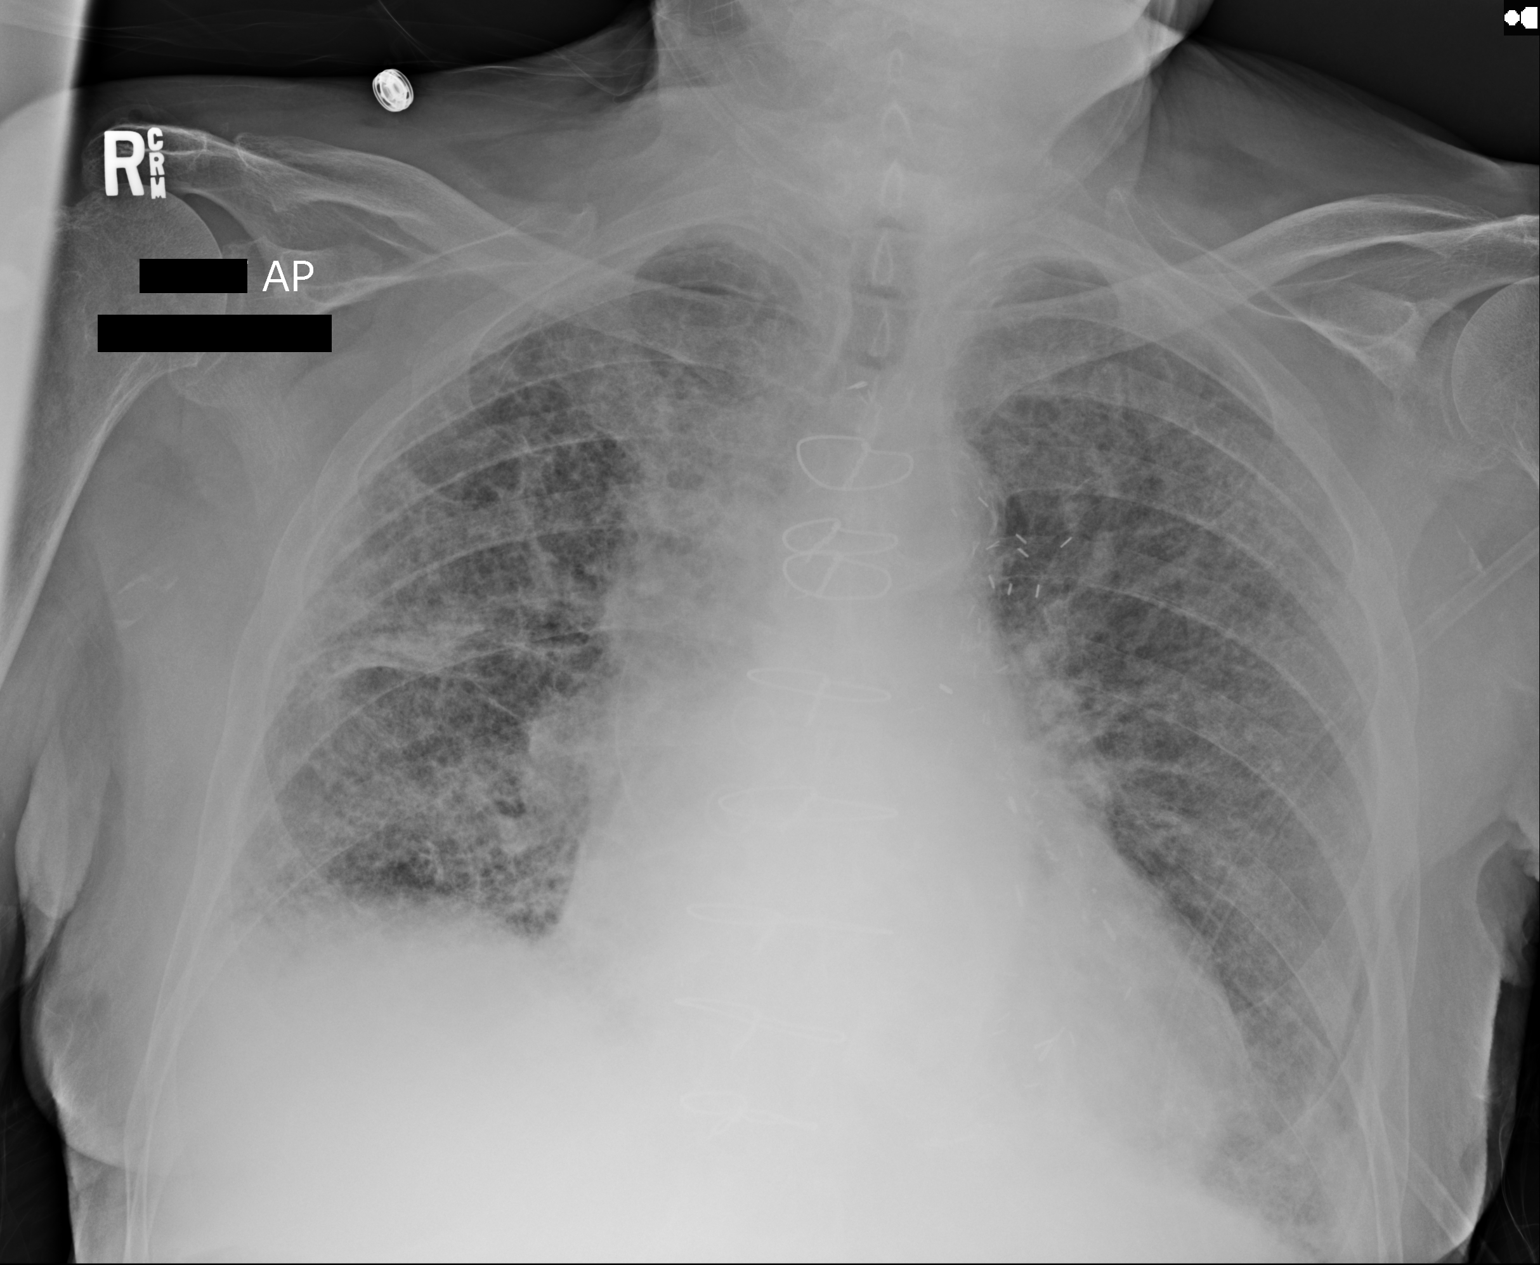

[1 of 1 positions shown; findings below may reference images not displayed]

FINDINGS: Extensive interstitial fibrosis is present. There is likely
superimposed interstitial edema. Cardiomegaly is noted.
IMPRESSION: Extensive interstitial fibrosis with superimposed interstitial
edema.
